# Patient Record
Sex: Male | Born: 1960 | Race: Black or African American | Hispanic: No | Marital: Married
Health system: Midwestern US, Community
[De-identification: ages and names within clinical notes are randomized; demographics above are authoritative.]

## PROBLEM LIST (undated history)

## (undated) DIAGNOSIS — M71162 Other infective bursitis, left knee: Secondary | ICD-10-CM

## (undated) DIAGNOSIS — I10 Essential (primary) hypertension: Secondary | ICD-10-CM

## (undated) DIAGNOSIS — E876 Hypokalemia: Secondary | ICD-10-CM

## (undated) DIAGNOSIS — N179 Acute kidney failure, unspecified: Secondary | ICD-10-CM

## (undated) DIAGNOSIS — Z8619 Personal history of other infectious and parasitic diseases: Secondary | ICD-10-CM

## (undated) HISTORY — DX: Other infective bursitis, left knee: M71.162

## (undated) HISTORY — DX: Acute kidney failure, unspecified: N17.9

## (undated) HISTORY — DX: Hypokalemia: E87.6

---

## 2004-04-14 ENCOUNTER — Ambulatory Visit: Payer: Self-pay | Admitting: Internal Medicine

## 2004-05-05 ENCOUNTER — Ambulatory Visit: Payer: Self-pay | Admitting: Internal Medicine

## 2004-05-25 ENCOUNTER — Ambulatory Visit: Payer: Self-pay | Admitting: Internal Medicine

## 2004-05-26 ENCOUNTER — Ambulatory Visit: Payer: Self-pay | Admitting: *Deleted

## 2004-07-06 ENCOUNTER — Ambulatory Visit: Payer: Self-pay | Admitting: Internal Medicine

## 2019-01-07 ENCOUNTER — Encounter (HOSPITAL_COMMUNITY): Payer: Self-pay | Admitting: Emergency Medicine

## 2019-01-07 ENCOUNTER — Inpatient Hospital Stay (HOSPITAL_COMMUNITY)
Admission: EM | Admit: 2019-01-07 | Discharge: 2019-01-11 | DRG: 486 | Disposition: A | Discharge status: Home / Self Care | Payer: Self-pay | Attending: Internal Medicine | Admitting: Internal Medicine

## 2019-01-07 ENCOUNTER — Other Ambulatory Visit: Payer: Self-pay

## 2019-01-07 DIAGNOSIS — Z79899 Other long term (current) drug therapy: Secondary | ICD-10-CM

## 2019-01-07 DIAGNOSIS — E669 Obesity, unspecified: Secondary | ICD-10-CM | POA: Diagnosis present

## 2019-01-07 DIAGNOSIS — Z6833 Body mass index (BMI) 33.0-33.9, adult: Secondary | ICD-10-CM

## 2019-01-07 DIAGNOSIS — M009 Pyogenic arthritis, unspecified: Secondary | ICD-10-CM | POA: Diagnosis present

## 2019-01-07 DIAGNOSIS — Z87891 Personal history of nicotine dependence: Secondary | ICD-10-CM

## 2019-01-07 DIAGNOSIS — B1920 Unspecified viral hepatitis C without hepatic coma: Secondary | ICD-10-CM | POA: Diagnosis present

## 2019-01-07 DIAGNOSIS — L03116 Cellulitis of left lower limb: Secondary | ICD-10-CM | POA: Diagnosis present

## 2019-01-07 DIAGNOSIS — Z20828 Contact with and (suspected) exposure to other viral communicable diseases: Secondary | ICD-10-CM | POA: Diagnosis present

## 2019-01-07 DIAGNOSIS — B9561 Methicillin susceptible Staphylococcus aureus infection as the cause of diseases classified elsewhere: Secondary | ICD-10-CM | POA: Diagnosis present

## 2019-01-07 DIAGNOSIS — K219 Gastro-esophageal reflux disease without esophagitis: Secondary | ICD-10-CM | POA: Diagnosis present

## 2019-01-07 DIAGNOSIS — M71162 Other infective bursitis, left knee: Principal | ICD-10-CM | POA: Diagnosis present

## 2019-01-07 DIAGNOSIS — N179 Acute kidney failure, unspecified: Secondary | ICD-10-CM

## 2019-01-07 DIAGNOSIS — A4901 Methicillin susceptible Staphylococcus aureus infection, unspecified site: Secondary | ICD-10-CM

## 2019-01-07 DIAGNOSIS — F1010 Alcohol abuse, uncomplicated: Secondary | ICD-10-CM | POA: Diagnosis present

## 2019-01-07 DIAGNOSIS — W19XXXA Unspecified fall, initial encounter: Secondary | ICD-10-CM

## 2019-01-07 DIAGNOSIS — E876 Hypokalemia: Secondary | ICD-10-CM | POA: Diagnosis present

## 2019-01-07 DIAGNOSIS — I10 Essential (primary) hypertension: Secondary | ICD-10-CM | POA: Diagnosis present

## 2019-01-07 DIAGNOSIS — Z8249 Family history of ischemic heart disease and other diseases of the circulatory system: Secondary | ICD-10-CM

## 2019-01-07 HISTORY — DX: Essential (primary) hypertension: I10

## 2019-01-07 HISTORY — DX: Personal history of other infectious and parasitic diseases: Z86.19

## 2019-01-07 MED ORDER — SODIUM CHLORIDE 0.9% FLUSH: 3.0000 mL | Freq: Once | INTRAVENOUS | Status: DC

## 2019-01-07 NOTE — ED Triage Notes (Signed)
Pt reports he thinks he has been bitten by a spider while working outside. Pt's left knee red, swollen, and warm to the touch. Reports fever at home, afebrile in triage.

## 2019-01-08 ENCOUNTER — Encounter (HOSPITAL_COMMUNITY)
Admission: EM | Disposition: A | Discharge status: Home / Self Care | Payer: Self-pay | Source: Home / Self Care | Attending: Internal Medicine

## 2019-01-08 ENCOUNTER — Emergency Department (HOSPITAL_COMMUNITY): Payer: Self-pay

## 2019-01-08 ENCOUNTER — Inpatient Hospital Stay (HOSPITAL_COMMUNITY): Payer: Self-pay | Admitting: Anesthesiology

## 2019-01-08 DIAGNOSIS — E876 Hypokalemia: Secondary | ICD-10-CM | POA: Insufficient documentation

## 2019-01-08 DIAGNOSIS — M009 Pyogenic arthritis, unspecified: Secondary | ICD-10-CM | POA: Diagnosis present

## 2019-01-08 DIAGNOSIS — Z8619 Personal history of other infectious and parasitic diseases: Secondary | ICD-10-CM

## 2019-01-08 DIAGNOSIS — I10 Essential (primary) hypertension: Secondary | ICD-10-CM

## 2019-01-08 DIAGNOSIS — M71162 Other infective bursitis, left knee: Secondary | ICD-10-CM | POA: Diagnosis present

## 2019-01-08 DIAGNOSIS — N179 Acute kidney failure, unspecified: Secondary | ICD-10-CM | POA: Insufficient documentation

## 2019-01-08 DIAGNOSIS — Z87891 Personal history of nicotine dependence: Secondary | ICD-10-CM

## 2019-01-08 HISTORY — PX: IRRIGATION AND DEBRIDEMENT KNEE: SHX5185

## 2019-01-08 LAB — PROTIME-INR
INR: 1.2 (ref 0.8–1.2)
Prothrombin Time: 15.1 seconds (ref 11.4–15.2)

## 2019-01-08 LAB — CBC WITH DIFFERENTIAL/PLATELET
Abs Immature Granulocytes: 0.76 10*3/uL — ABNORMAL HIGH (ref 0.00–0.07)
Basophils Absolute: 0 10*3/uL (ref 0.0–0.1)
Basophils Relative: 0 %
Eosinophils Absolute: 0 10*3/uL (ref 0.0–0.5)
Eosinophils Relative: 0 %
HCT: 39.3 % (ref 39.0–52.0)
Hemoglobin: 13.4 g/dL (ref 13.0–17.0)
Immature Granulocytes: 3 %
Lymphocytes Relative: 3 %
Lymphs Abs: 0.7 10*3/uL (ref 0.7–4.0)
MCH: 30.5 pg (ref 26.0–34.0)
MCHC: 34.1 g/dL (ref 30.0–36.0)
MCV: 89.3 fL (ref 80.0–100.0)
Monocytes Absolute: 1.8 10*3/uL — ABNORMAL HIGH (ref 0.1–1.0)
Monocytes Relative: 7 %
Neutro Abs: 22.9 10*3/uL — ABNORMAL HIGH (ref 1.7–7.7)
Neutrophils Relative %: 87 %
Platelets: 252 10*3/uL (ref 150–400)
RBC: 4.4 MIL/uL (ref 4.22–5.81)
RDW: 12.6 % (ref 11.5–15.5)
WBC: 26.2 10*3/uL — ABNORMAL HIGH (ref 4.0–10.5)
nRBC: 0 % (ref 0.0–0.2)

## 2019-01-08 LAB — COMPREHENSIVE METABOLIC PANEL
ALT: 25 U/L (ref 0–44)
AST: 23 U/L (ref 15–41)
Albumin: 3.3 g/dL — ABNORMAL LOW (ref 3.5–5.0)
Alkaline Phosphatase: 58 U/L (ref 38–126)
Anion gap: 13 (ref 5–15)
BUN: 28 mg/dL — ABNORMAL HIGH (ref 6–20)
CO2: 20 mmol/L — ABNORMAL LOW (ref 22–32)
Calcium: 8.6 mg/dL — ABNORMAL LOW (ref 8.9–10.3)
Chloride: 101 mmol/L (ref 98–111)
Creatinine, Ser: 1.59 mg/dL — ABNORMAL HIGH (ref 0.61–1.24)
GFR calc Af Amer: 55 mL/min — ABNORMAL LOW (ref >60)
GFR calc non Af Amer: 47 mL/min — ABNORMAL LOW (ref >60)
Glucose, Bld: 154 mg/dL — ABNORMAL HIGH (ref 70–99)
Potassium: 2.9 mmol/L — ABNORMAL LOW (ref 3.5–5.1)
Sodium: 134 mmol/L — ABNORMAL LOW (ref 135–145)
Total Bilirubin: 1.1 mg/dL (ref 0.3–1.2)
Total Protein: 6.7 g/dL (ref 6.5–8.1)

## 2019-01-08 LAB — URINALYSIS, ROUTINE W REFLEX MICROSCOPIC
Bilirubin Urine: NEGATIVE
Glucose, UA: NEGATIVE mg/dL
Ketones, ur: 5 mg/dL — AB
Leukocytes,Ua: NEGATIVE
Nitrite: NEGATIVE
Protein, ur: NEGATIVE mg/dL
Specific Gravity, Urine: 1.019 (ref 1.005–1.030)
pH: 5 (ref 5.0–8.0)

## 2019-01-08 LAB — LACTIC ACID, PLASMA
Lactic Acid, Venous: 1.6 mmol/L (ref 0.5–1.9)
Lactic Acid, Venous: 1.8 mmol/L (ref 0.5–1.9)
Lactic Acid, Venous: 2.4 mmol/L (ref 0.5–1.9)

## 2019-01-08 LAB — HIV ANTIBODY (ROUTINE TESTING W REFLEX): HIV Screen 4th Generation wRfx: NONREACTIVE

## 2019-01-08 LAB — SARS CORONAVIRUS 2 BY RT PCR (HOSPITAL ORDER, PERFORMED IN ~~LOC~~ HOSPITAL LAB): SARS Coronavirus 2: NEGATIVE

## 2019-01-08 LAB — HEMOGLOBIN A1C
Hgb A1c MFr Bld: 5.6 % (ref 4.8–5.6)
Mean Plasma Glucose: 114.02 mg/dL

## 2019-01-08 LAB — APTT: aPTT: 33 seconds (ref 24–36)

## 2019-01-08 LAB — C-REACTIVE PROTEIN: CRP: 27.4 mg/dL — ABNORMAL HIGH (ref <1.0)

## 2019-01-08 LAB — SEDIMENTATION RATE: Sed Rate: 27 mm/hr — ABNORMAL HIGH (ref 0–16)

## 2019-01-08 SURGERY — IRRIGATION AND DEBRIDEMENT KNEE
Anesthesia: General | Site: Knee | Laterality: Left

## 2019-01-08 MED ORDER — AMLODIPINE BESYLATE 5 MG PO TABS
5.0000 mg | ORAL_TABLET | Freq: Every day | ORAL | Status: DC
  Administered 2019-01-08 – 2019-01-10 (×3): 5 mg via ORAL
  Filled 2019-01-08 (×3): qty 1

## 2019-01-08 MED ORDER — SODIUM CHLORIDE 0.9 % IV SOLN
2.0000 g | Freq: Once | INTRAVENOUS | Status: AC
  Administered 2019-01-08: 2 g via INTRAVENOUS
  Filled 2019-01-08: qty 20

## 2019-01-08 MED ORDER — ONDANSETRON HCL 4 MG/2ML IJ SOLN
INTRAMUSCULAR | Status: AC
  Filled 2019-01-08: qty 2

## 2019-01-08 MED ORDER — DEXAMETHASONE SODIUM PHOSPHATE 4 MG/ML IJ SOLN
INTRAMUSCULAR | Status: DC | PRN
  Administered 2019-01-08: 10 mg via INTRAVENOUS

## 2019-01-08 MED ORDER — LACTATED RINGERS IV SOLN
INTRAVENOUS | Status: DC
  Administered 2019-01-08 – 2019-01-10 (×4): via INTRAVENOUS
  Filled 2019-01-08 (×2): qty 1000

## 2019-01-08 MED ORDER — LACTATED RINGERS IV BOLUS
1000.0000 mL | Freq: Once | INTRAVENOUS | Status: AC
  Administered 2019-01-08: 1000 mL via INTRAVENOUS

## 2019-01-08 MED ORDER — TRIAMTERENE-HCTZ 75-50 MG PO TABS
1.0000 | ORAL_TABLET | Freq: Every day | ORAL | Status: DC
  Administered 2019-01-08 – 2019-01-11 (×4): 1 via ORAL
  Filled 2019-01-08 (×6): qty 1

## 2019-01-08 MED ORDER — ACETAMINOPHEN 650 MG RE SUPP: 650.0000 mg | Freq: Four times a day (QID) | RECTAL | Status: DC | PRN

## 2019-01-08 MED ORDER — HYDROMORPHONE HCL 1 MG/ML IJ SOLN
1.0000 mg | Freq: Once | INTRAMUSCULAR | Status: AC
  Administered 2019-01-08: 1 mg via INTRAVENOUS
  Filled 2019-01-08: qty 1

## 2019-01-08 MED ORDER — ACETAMINOPHEN 325 MG PO TABS
650.0000 mg | ORAL_TABLET | Freq: Four times a day (QID) | ORAL | Status: DC | PRN
  Administered 2019-01-09 (×2): 650 mg via ORAL
  Filled 2019-01-08 (×2): qty 2

## 2019-01-08 MED ORDER — DEXAMETHASONE SODIUM PHOSPHATE 10 MG/ML IJ SOLN
INTRAMUSCULAR | Status: AC
  Filled 2019-01-08: qty 1

## 2019-01-08 MED ORDER — MIDAZOLAM HCL 2 MG/2ML IJ SOLN
INTRAMUSCULAR | Status: AC
  Filled 2019-01-08: qty 2

## 2019-01-08 MED ORDER — VITAMIN B-1 100 MG PO TABS
100.0000 mg | ORAL_TABLET | Freq: Every day | ORAL | Status: DC
  Administered 2019-01-08 – 2019-01-11 (×4): 100 mg via ORAL
  Filled 2019-01-08 (×4): qty 1

## 2019-01-08 MED ORDER — SODIUM CHLORIDE 0.9 % IV SOLN
2.0000 g | INTRAVENOUS | Status: DC
  Filled 2019-01-08: qty 20

## 2019-01-08 MED ORDER — POTASSIUM CHLORIDE CRYS ER 20 MEQ PO TBCR
40.0000 meq | EXTENDED_RELEASE_TABLET | Freq: Once | ORAL | Status: AC
  Administered 2019-01-08: 40 meq via ORAL
  Filled 2019-01-08: qty 2

## 2019-01-08 MED ORDER — CLONAZEPAM 0.5 MG PO TABS
0.5000 mg | ORAL_TABLET | Freq: Every day | ORAL | Status: DC | PRN
  Administered 2019-01-11: 0.5 mg via ORAL
  Filled 2019-01-08: qty 1

## 2019-01-08 MED ORDER — SODIUM CHLORIDE 0.9 % IV BOLUS
1000.0000 mL | Freq: Once | INTRAVENOUS | Status: AC
  Administered 2019-01-08: 1000 mL via INTRAVENOUS

## 2019-01-08 MED ORDER — POLYETHYLENE GLYCOL 3350 17 G PO PACK: 17.0000 g | PACK | Freq: Every day | ORAL | Status: DC

## 2019-01-08 MED ORDER — PROPOFOL 10 MG/ML IV BOLUS
INTRAVENOUS | Status: DC | PRN
  Administered 2019-01-08: 200 mg via INTRAVENOUS
  Administered 2019-01-08: 60 mg via INTRAVENOUS
  Administered 2019-01-08 (×2): 50 mg via INTRAVENOUS

## 2019-01-08 MED ORDER — VANCOMYCIN HCL IN DEXTROSE 1-5 GM/200ML-% IV SOLN: 1000.0000 mg | Freq: Once | INTRAVENOUS | Status: DC

## 2019-01-08 MED ORDER — VANCOMYCIN HCL IN DEXTROSE 1-5 GM/200ML-% IV SOLN
1000.0000 mg | INTRAVENOUS | Status: DC
  Administered 2019-01-09 – 2019-01-10 (×2): 1000 mg via INTRAVENOUS
  Filled 2019-01-08 (×2): qty 200

## 2019-01-08 MED ORDER — FENTANYL CITRATE (PF) 100 MCG/2ML IJ SOLN
INTRAMUSCULAR | Status: DC | PRN
  Administered 2019-01-08 (×2): 50 ug via INTRAVENOUS

## 2019-01-08 MED ORDER — OXYCODONE HCL 5 MG PO TABS
5.0000 mg | ORAL_TABLET | ORAL | Status: DC | PRN
  Administered 2019-01-08 – 2019-01-11 (×6): 5 mg via ORAL
  Filled 2019-01-08 (×6): qty 1

## 2019-01-08 MED ORDER — THIAMINE HCL 100 MG/ML IJ SOLN: 100.0000 mg | Freq: Every day | INTRAMUSCULAR | Status: DC

## 2019-01-08 MED ORDER — 0.9 % SODIUM CHLORIDE (POUR BTL) OPTIME
TOPICAL | Status: DC | PRN
  Administered 2019-01-08: 1000 mL

## 2019-01-08 MED ORDER — HYDROMORPHONE HCL 1 MG/ML IJ SOLN: 0.5000 mg | INTRAMUSCULAR | Status: DC | PRN

## 2019-01-08 MED ORDER — ONDANSETRON HCL 4 MG PO TABS: 4.0000 mg | ORAL_TABLET | Freq: Four times a day (QID) | ORAL | Status: DC | PRN

## 2019-01-08 MED ORDER — SODIUM CHLORIDE 0.9 % IR SOLN
Status: DC | PRN
  Administered 2019-01-08: 3000 mL

## 2019-01-08 MED ORDER — FOLIC ACID 1 MG PO TABS
1.0000 mg | ORAL_TABLET | Freq: Every day | ORAL | Status: DC
  Administered 2019-01-08 – 2019-01-11 (×4): 1 mg via ORAL
  Filled 2019-01-08 (×4): qty 1

## 2019-01-08 MED ORDER — FENTANYL CITRATE (PF) 250 MCG/5ML IJ SOLN
INTRAMUSCULAR | Status: AC
  Filled 2019-01-08: qty 5

## 2019-01-08 MED ORDER — MIDAZOLAM HCL 5 MG/5ML IJ SOLN
INTRAMUSCULAR | Status: DC | PRN
  Administered 2019-01-08: 2 mg via INTRAVENOUS

## 2019-01-08 MED ORDER — ONDANSETRON HCL 4 MG/2ML IJ SOLN
INTRAMUSCULAR | Status: DC | PRN
  Administered 2019-01-08: 4 mg via INTRAVENOUS

## 2019-01-08 MED ORDER — VANCOMYCIN HCL 10 G IV SOLR
2000.0000 mg | Freq: Once | INTRAVENOUS | Status: AC
  Administered 2019-01-08: 10:00:00 2000 mg via INTRAVENOUS
  Filled 2019-01-08: qty 2000

## 2019-01-08 MED ORDER — ONDANSETRON HCL 4 MG/2ML IJ SOLN: 4.0000 mg | Freq: Four times a day (QID) | INTRAMUSCULAR | Status: DC | PRN

## 2019-01-08 MED ORDER — LIDOCAINE HCL (CARDIAC) PF 100 MG/5ML IV SOSY
PREFILLED_SYRINGE | INTRAVENOUS | Status: DC | PRN
  Administered 2019-01-08: 60 mg via INTRAVENOUS

## 2019-01-08 MED ORDER — ADULT MULTIVITAMIN W/MINERALS CH
1.0000 | ORAL_TABLET | Freq: Every day | ORAL | Status: DC
  Administered 2019-01-08 – 2019-01-11 (×4): 1 via ORAL
  Filled 2019-01-08 (×4): qty 1

## 2019-01-08 SURGICAL SUPPLY — 45 items
BNDG COHESIVE 6X5 TAN STRL LF (GAUZE/BANDAGES/DRESSINGS) ×3 IMPLANT
BNDG GAUZE ELAST 4 BULKY (GAUZE/BANDAGES/DRESSINGS) ×3 IMPLANT
COVER SURGICAL LIGHT HANDLE (MISCELLANEOUS) ×3 IMPLANT
CUFF TOURN SGL QUICK 34 (TOURNIQUET CUFF)
CUFF TOURN SGL QUICK 42 (TOURNIQUET CUFF) ×3 IMPLANT
CUFF TRNQT CYL 34X4.125X (TOURNIQUET CUFF) IMPLANT
DRAPE IMP U-DRAPE 54X76 (DRAPES) ×3 IMPLANT
DRAPE ORTHO SPLIT 77X108 STRL (DRAPES) ×3
DRAPE SURG ORHT 6 SPLT 77X108 (DRAPES) ×1 IMPLANT
DRSG PAD ABDOMINAL 8X10 ST (GAUZE/BANDAGES/DRESSINGS) ×2 IMPLANT
ELECT CAUTERY BLADE 6.4 (BLADE) ×6 IMPLANT
ELECT REM PT RETURN 9FT ADLT (ELECTROSURGICAL) ×3
ELECTRODE REM PT RTRN 9FT ADLT (ELECTROSURGICAL) ×1 IMPLANT
FACESHIELD WRAPAROUND (MASK) IMPLANT
FACESHIELD WRAPAROUND OR TEAM (MASK) IMPLANT
GAUZE PACKING IODOFORM 1/2 (PACKING) ×3 IMPLANT
GAUZE SPONGE 4X4 12PLY STRL (GAUZE/BANDAGES/DRESSINGS) ×2 IMPLANT
GAUZE XEROFORM 5X9 LF (GAUZE/BANDAGES/DRESSINGS) ×3 IMPLANT
GLOVE BIOGEL PI IND STRL 7.0 (GLOVE) ×1 IMPLANT
GLOVE BIOGEL PI INDICATOR 7.0 (GLOVE) ×2
GLOVE ECLIPSE 7.0 STRL STRAW (GLOVE) ×3 IMPLANT
GLOVE SKINSENSE NS SZ7.5 (GLOVE) ×8
GLOVE SKINSENSE STRL SZ7.5 (GLOVE) ×4 IMPLANT
GOWN STRL REIN XL XLG (GOWN DISPOSABLE) ×6 IMPLANT
HANDPIECE INTERPULSE COAX TIP (DISPOSABLE) ×3
KIT BASIN OR (CUSTOM PROCEDURE TRAY) ×3 IMPLANT
KIT TURNOVER KIT B (KITS) ×3 IMPLANT
MANIFOLD NEPTUNE II (INSTRUMENTS) ×3 IMPLANT
NS IRRIG 1000ML POUR BTL (IV SOLUTION) ×3 IMPLANT
PACK ORTHO EXTREMITY (CUSTOM PROCEDURE TRAY) ×3 IMPLANT
PACK UNIVERSAL I (CUSTOM PROCEDURE TRAY) ×3 IMPLANT
PAD ARMBOARD 7.5X6 YLW CONV (MISCELLANEOUS) ×6 IMPLANT
SET HNDPC FAN SPRY TIP SCT (DISPOSABLE) ×1 IMPLANT
SPONGE LAP 18X18 RF (DISPOSABLE) ×3 IMPLANT
STOCKINETTE IMPERVIOUS 9X36 MD (GAUZE/BANDAGES/DRESSINGS) ×3 IMPLANT
SUT ETHILON 2 0 FS 18 (SUTURE) ×3 IMPLANT
SUT MON AB 2-0 CT1 36 (SUTURE) ×3 IMPLANT
SWAB CULTURE ESWAB REG 1ML (MISCELLANEOUS) ×3 IMPLANT
TOWEL GREEN STERILE (TOWEL DISPOSABLE) ×3 IMPLANT
TOWEL GREEN STERILE FF (TOWEL DISPOSABLE) ×3 IMPLANT
TUBE CONNECTING 12'X1/4 (SUCTIONS) ×1
TUBE CONNECTING 12X1/4 (SUCTIONS) ×2 IMPLANT
UNDERPAD 30X30 (UNDERPADS AND DIAPERS) ×3 IMPLANT
WATER STERILE IRR 1000ML POUR (IV SOLUTION) ×3 IMPLANT
YANKAUER SUCT BULB TIP NO VENT (SUCTIONS) ×3 IMPLANT

## 2019-01-08 NOTE — Progress Notes (Signed)
Pharmacy Antibiotic Note  Chris Lynch is a 58 y.o. male admitted on 01/07/2019 with cellulitis.  Pharmacy has been consulted for vancomycin dosing. Pt is afebrile and WBC is elevated at 26.2. SCr is elevated but unclear baseline. Lactic acid is <2.   Plan: Vancomycin 2gm IV x 1 then 1gm IV Q24H F/u renal fxn, C&S, clinical status and peak/trough at SS    Temp (24hrs), Avg:98.2 F (36.8 C), Min:98.2 F (36.8 C), Max:98.2 F (36.8 C)  Recent Labs  Lab 01/08/19 0003  WBC 26.2*  CREATININE 1.59*  LATICACIDVEN 1.6    Estimated Creatinine Clearance: 57.1 mL/min (A) (by C-G formula based on SCr of 1.59 mg/dL (H)).    No Known Allergies  Antimicrobials this admission: Vanc 11/24>> CTX x 1 11/24*  Dose adjustments this admission: N/A  Microbiology results: Pending  Thank you for allowing pharmacy to be a part of this patient's care.  Aalina Brege, Rande Lawman 01/08/2019 7:20 AM

## 2019-01-08 NOTE — ED Notes (Signed)
Pt is NSR on monitor 

## 2019-01-08 NOTE — Op Note (Signed)
   Date of Surgery: 01/08/2019  INDICATIONS: Chris Lynch is a 58 y.o.-year-old male with a septic prepatellar bursitis;  The patient did consent to the procedure after discussion of the risks and benefits.  PREOPERATIVE DIAGNOSIS: Septic left prepatellar bursitis  POSTOPERATIVE DIAGNOSIS: Same.  PROCEDURE: Irrigation and debridement of left prepatellar septic bursitis  SURGEON: N. Eduard Roux, M.D.  ASSIST: none.  ANESTHESIA:  general  IV FLUIDS AND URINE: See anesthesia.  ESTIMATED BLOOD LOSS: minimal mL.  IMPLANTS: none  DRAINS: iodoform packing  COMPLICATIONS: see description of procedure.  DESCRIPTION OF PROCEDURE: The patient was brought to the operating room and placed supine on the operating table.  The patient had been signed prior to the procedure and this was documented. The patient had the anesthesia placed by the anesthesiologist.  A time-out was performed to confirm that this was the correct patient, site, side and location. The patient did receive antibiotics after cultures were obtained and was re-dosed during the procedure as needed at indicated intervals.  The patient had the operative extremity prepped and draped in the standard surgical fashion.    Incision was made on the lateral aspect of the patella border in a full-thickness fashion.  Blunt dissection was then carried down to the bursa with tonsil.  There was return of bloody pus.  This was cultured.  Sharp excisional debridement was then performed using a rondure.  After thorough debridement hemostasis was obtained.  Through noncellulitic skin I aspirated the left knee joint to confirm no evidence of septic arthritis.  I then thoroughly irrigated the bursa with 3 L normal saline.  This was then packed with iodoform.  The surgical incision was partially closed with interrupted 2-0 nylon sutures.  Sterile dressings were applied.  The blisters were dressed with Xeroform.  Patient tolerated the procedure well had no me  complications.  POSTOPERATIVE PLAN: We will follow the patient clinically for improvement.  He will need infectious disease consult for recommendation on antibiotic treatment and duration.  We will remove the iodoform packing in a couple days.  Azucena Cecil, MD 5:39 PM

## 2019-01-08 NOTE — Transfer of Care (Signed)
Immediate Anesthesia Transfer of Care Note  Patient: Chris Lynch  Procedure(s) Performed: IRRIGATION AND DEBRIDEMENT OF PREPATELLAR LEFT KNEE (Left Knee)  Patient Location: PACU  Anesthesia Type:General  Level of Consciousness: drowsy  Airway & Oxygen Therapy: Patient Spontanous Breathing and Patient connected to face mask oxygen  Post-op Assessment: Report given to RN and Post -op Vital signs reviewed and stable  Post vital signs: Reviewed and stable  Last Vitals:  Vitals Value Taken Time  BP 141/87 01/08/19 1752  Temp 37.2 C 01/08/19 1752  Pulse 96 01/08/19 1752  Resp 15 01/08/19 1757  SpO2 95 % 01/08/19 1752  Vitals shown include unvalidated device data.  Last Pain:  Vitals:   01/08/19 1752  TempSrc:   PainSc: (P) Asleep         Complications: No apparent anesthesia complications

## 2019-01-08 NOTE — ED Provider Notes (Addendum)
Medical screening examination/treatment/procedure(s) were conducted as a shared visit with non-physician practitioner(s) and myself.  I personally evaluated the patient during the encounter. Briefly, the patient is a 58 y.o. male with no significant medical history who presents to the ED with leg infection.  Patient with unremarkable vitals.  Patient appears to have left lower leg cellulitis.  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  He was working on a deck and has been on his knees over the last several days.  Had skin breakdown over his left knee and now has progressed to what appears to be a cellulitis of his lower leg with some blistering.  Has fairly good range of motion at his knee and doubt septic joint.  Has skin breakdown over the patellar which is likely the nidus for infection.  Redness and swelling tract both up and down the leg.  He has good pulses.  Does not appear to have necrotizing process.  Patient with leukocytosis of 26 but normal lactic acid.  X-rays were overall unremarkable.  No soft tissue gas.  Overall consistent with a cellulitis with possible septic bursa, will consult ortho.  There is no joint effusion of the knee.  Will admit for IV antibiotics.  Ortho will take to OR for septic bursa. Patient with surrounding cellulitis.   Hemodynamically stable throughout my care.  This chart was dictated using voice recognition software.  Despite best efforts to proofread,  errors can occur which can change the documentation meaning.     EKG Interpretation  Date/Time:  Tuesday January 08 2019 07:02:01 EST Ventricular Rate:  103 PR Interval:    QRS Duration: 97 QT Interval:  312 QTC Calculation: 409 R Axis:   66 Text Interpretation: Sinus tachycardia Anteroseptal infarct, old Nonspecific repol abnormality, diffuse leads Confirmed by Lennice Sites [phone]) on 01/08/2019 7:35:52 AM           Lennice Sites, DO 01/08/19 6045    Lennice Sites, DO 01/08/19 4098

## 2019-01-08 NOTE — ED Notes (Signed)
Patient transported to X-ray 

## 2019-01-08 NOTE — H&P (Signed)
Date: 01/08/2019               Patient Name:  LENNEX PIETILA MRN: [id number]  DOB: 1960-12-09 Age / Sex: 58 y.o., male   PCP: Leonard Downing, MD         Medical Service: Internal Medicine Teaching Service         Attending Physician: Dr. Lucious Groves, DO    First Contact: Dr. Madilyn Fireman Pager: [phone]  Second Contact: Dr. Koleen Distance Pager: [phone]       After Hours (After 5p/  First Contact Pager: [phone]  weekends / holidays): Second Contact Pager: [phone]   Chief Complaint: Left knee pain  History of Present Illness: Peretz Thieme is a 58 y.o male with HTN and Hepatitis C s/p treatment with interferon who presented to the ED with progressive left knee pain of 4 days duration. History was obtained via the patient and through chart review.   Patient states that on Thursday he was working under a deck. He felt something crawling on his leg and subsequently felt pinched around his left knee. He thought that a spider might have bit him. On Friday he developed a pimple and subsequently lanced it with a warm needle. Over the course of the next two days he developed significant pain and swelling. He tried to rest and stay off his knee and used warm compresses to help with the pain/swelling. On Monday he went to work but noticed significant pain while trying to move around. That evening he developed fever of 102F which prompted him to seek further medical care. He has noticed some discharge from his knee but is unable to assess the quality or consistency. He has noticed blistering down his left lower leg. He is never had anything like this before. He does have hypertension but denies a history of diabetes. ROS is positive for some nausea but negative for headaches, visual changes, sinus congestion, SHOB, CP, abdominal pain, diarrhea, rash.   Meds: Patient is currently no on any prescription medications.    Allergies: Allergies as of 01/07/2019  . (No Known Allergies)   Past  Medical History:  Diagnosis Date  . Hypertension    Family History: His father had his first heart attack at the age of 21. He has since had multiple intravascular procedures for peripheral artery disease and coronary artery disease. His mother and sister both died of metastatic lung cancer.  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 He is divorced and has three grown children. He does have a couple grandchildren. He is currently dating someone. He is not smoked in over 20 years. He did use injection drugs in his 59s but has not used them in several years. He does drink 1/2 pint per liquor per weekday and one point will occur on weekends. This is a new habit over the past year.  Review of Systems: A complete ROS was negative except as per HPI.  Physical Exam: Blood pressure (!) 152/77, pulse (!) 104, temperature 98 F (36.7 C), temperature source Oral, resp. rate 18, height 5\' 7"  (1.702 m), weight 97.5 kg, SpO2 97 %.  General: Obese male in no acute distress HENT: Normocephalic, atraumatic, moist mucus membranes, thick neck Pulm: Good air movement with no wheezing or crackles  CV: RRR, no murmurs, no rubs  Abdomen: Active bowel sounds, soft, non-distended, no tenderness to palpation  Extremities: Pulses palpable in all extremities, erythema and  tenderness to palpation of the left knee with distal clear vesicles/bulla Skin: Warm and dry  Neuro: Alert and oriented x 3  EKG: personally reviewed my interpretation is sinus rhythm with normal axis and intervals. ST scoping noted in the inferior lateral leads.   DG Left Femur and Tibia/Fibula  Subcutaneous edema compatible with cellulitis. Negative for soft tissue gas or foreign body.  Assessment & Plan by Problem: Active Problems:   Arthritis, septic, knee (HCC)  Daelin Haste is a 58 y.o male with HTN and Hepatitis C s/p treatment with interferon who presented to the ED with signs and symptoms  concerning for septic arthritis of the left knee. Orthopedic surgery has been consult and plan to take the patient to the OR this afternoon for I&D. He was subsequently admitted for further evaluation and management.   Septic arthritis, left knee - Presented with left knee pain, erythema, and swelling and systemic signs of infection concerning for septic arthritis of the left knee  - Continue vancomycin and ceftriaxone  - Follow aspirate cultures and operative cultures  - Add on cell count with crystal analysis to the aspirate  - Appreciate orthopedic consult and plan for OR this evening  - Pain control with Oxycodone 5 mg every 4 hours PRN and Dilaudid 0.5 mg every 2 hours PRN for pain  - Daily miralax while on opiates.  - PT/OT consult after surgery  - Continue NPO   Possible AKI Hypokalemia  - No baseline creatinine for comparison  - Will give IVF  - 40 mEq PO potassium  - Recheck BMP in the AM  Hypertension - Continue to monitor  - May need to start a medication post-op   Probable OSA - Endorses loud snoring and witnessed apnea with thick neck  - Monitor respiratory status after OR  Alcohol Use Disorder - No history of withdrawal but drinks heavily  - CIWA protocol   Diet: NPO  VTE: SCDs until after surgery  CODE STATUS: Full Code  Dispo: Admit patient to Inpatient with expected length of stay greater than 2 midnights.  SignedLevora Dredge, MD 01/08/2019, 10:28 AM  Pager: [phone]

## 2019-01-08 NOTE — Anesthesia Procedure Notes (Addendum)
Procedure Name: LMA Insertion Date/Time: 01/08/2019 4:58 PM Performed by: Griffin Dakin, CRNA Pre-anesthesia Checklist: Patient identified, Emergency Drugs available, Suction available and Patient being monitored Patient Re-evaluated:Patient Re-evaluated prior to induction Oxygen Delivery Method: Circle system utilized Preoxygenation: Pre-oxygenation with 100% oxygen Induction Type: IV induction Ventilation: Mask ventilation without difficulty LMA: LMA inserted LMA Size: 5.0 Tube type: Oral Number of attempts: 1 Placement Confirmation: positive ETCO2 and breath sounds checked- equal and bilateral Tube secured with: Tape Dental Injury: Teeth and Oropharynx as per pre-operative assessment

## 2019-01-08 NOTE — Consult Note (Signed)
Reason for Consult:Left knee pain Referring Physician: A Curatolo  Chris Lynch is an 58 y.o. male.  HPI: Chris Lynch comes in with a 5d hx/o left knee pain. It seemed to start late last week and he thought he may have been bit by a spider. He put ice packs on it through the weekend but it swelled up quite a bit on Sunday after spending some time on it at a store. Nevertheless he went to work yesterday and he had some drainage from it. He ran fevers over 102 at home and decided to come to the ED for treatment. He denies similar hx/o. 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  Past Medical History:  Diagnosis Date  . Hypertension     History reviewed. No pertinent surgical history.  No family history on file.  Social History:  reports that he has quit smoking. He has never used smokeless tobacco. He reports current alcohol use. He reports that he does not use drugs.  Allergies: No Known Allergies  Medications: I have reviewed the patient's current medications.  Results for orders placed or performed during the hospital encounter of 01/07/19 (from the past 48 hour(s))  Lactic acid, plasma     Status: None   Collection Time: 01/08/19 12:03 AM  Result Value Ref Range   Lactic Acid, Venous 1.6 0.5 - 1.9 mmol/L    Comment: Performed at [facility], [address] [place], [place] [address]  Comprehensive metabolic panel     Status: Abnormal   Collection Time: 01/08/19 12:03 AM  Result Value Ref Range   Sodium 134 (L) 135 - 145 mmol/L   Potassium 2.9 (L) 3.5 - 5.1 mmol/L   Chloride 101 98 - 111 mmol/L   CO2 20 (L) 22 - 32 mmol/L   Glucose, Bld 154 (H) 70 - 99 mg/dL   BUN 28 (H) 6 - 20 mg/dL   Creatinine, Ser 4.17 (H) 0.61 - 1.24 mg/dL   Calcium 8.6 (L) 8.9 - 10.3 mg/dL   Total Protein 6.7 6.5 - 8.1 g/dL   Albumin 3.3 (L) 3.5 - 5.0 g/dL   AST 23 15 - 41 U/L   ALT 25 0 - 44 U/L   Alkaline Phosphatase 58 38 - 126 U/L   Total Bilirubin 1.1 0.3 - 1.2 mg/dL   GFR calc  non Af Amer 47 (L) >60 mL/min   GFR calc Af Amer 55 (L) >60 mL/min   Anion gap 13 5 - 15    Comment: Performed at [facility], [address], [place], [place] [address]  CBC with Differential     Status: Abnormal   Collection Time: 01/08/19 12:03 AM  Result Value Ref Range   WBC 26.2 (H) 4.0 - 10.5 K/uL   RBC 4.40 4.22 - 5.81 MIL/uL   Hemoglobin 13.4 13.0 - 17.0 g/dL   HCT 48.1 85.6 - 31.4 %   MCV 89.3 80.0 - 100.0 fL   MCH 30.5 26.0 - 34.0 pg   MCHC 34.1 30.0 - 36.0 g/dL   RDW 97.0 26.3 - 78.5 %   Platelets 252 150 - 400 K/uL   nRBC 0.0 0.0 - 0.2 %   Neutrophils Relative % 87 %   Neutro Abs 22.9 (H) 1.7 - 7.7 K/uL   Lymphocytes Relative 3 %   Lymphs Abs 0.7 0.7 - 4.0 K/uL   Monocytes Relative 7 %   Monocytes Absolute 1.8 (H) 0.1 -  1.0 K/uL   Eosinophils Relative 0 %   Eosinophils Absolute 0.0 0.0 - 0.5 K/uL   Basophils Relative 0 %   Basophils Absolute 0.0 0.0 - 0.1 K/uL   Immature Granulocytes 3 %   Abs Immature Granulocytes 0.76 (H) 0.00 - 0.07 K/uL    Comment: Performed at [facility] [address], [place], [place] [address]  Lactic acid, plasma     Status: Abnormal   Collection Time: 01/08/19  7:54 AM  Result Value Ref Range   Lactic Acid, Venous 2.4 (HH) 0.5 - 1.9 mmol/L    Comment: CRITICAL RESULT CALLED TO, READ BACK BY AND VERIFIED WITH: C.MARSHALL,RN11/24/2020 0906 DAVISB Performed at [facility], [place] [address] [place], [place] [address]   APTT     Status: None   Collection Time: 01/08/19  7:54 AM  Result Value Ref Range   aPTT 33 24 - 36 seconds    Comment: Performed at [facility] [address], [place], [place] [address]  Protime-INR     Status: None   Collection Time: 01/08/19  7:54 AM  Result Value Ref Range   Prothrombin Time 15.1 11.4 - 15.2 seconds   INR 1.2 0.8 - 1.2    Comment: (NOTE) INR goal varies based on device and disease states. Performed at [facility], [place] [address], [place],  [place] [address]     Dg Chest 1 View  Result Date: 01/08/2019 CLINICAL DATA:  Leg swelling EXAM: CHEST  1 VIEW COMPARISON:  None. FINDINGS: The heart size and mediastinal contours are within normal limits. Both lungs are clear. The visualized skeletal structures are unremarkable. IMPRESSION: No acute cardiopulmonary findings. Electronically Signed   By: Davina Poke M.D.   On: 01/08/2019 07:56   Dg Tibia/fibula Left  Result Date: 01/08/2019 CLINICAL DATA:  Left lower leg pain, redness and swelling. The patient reports he may have sustained a spider bite. EXAM: LEFT TIBIA AND FIBULA - 2 VIEW COMPARISON:  None. FINDINGS: Subcutaneous tissues are infiltrated. No soft tissue gas or radiopaque foreign body. No bony or joint abnormality. IMPRESSION: Subcutaneous edema compatible with cellulitis. Negative for soft tissue gas or foreign body. Electronically Signed   By: Inge Rise M.D.   On: 01/08/2019 07:57   Dg Knee Complete 4 Views Left  Result Date: 01/08/2019 CLINICAL DATA:  Left knee pain, redness and swelling. The patient may have suffered a spider bite. Initial encounter. EXAM: LEFT KNEE - COMPLETE 4+ VIEW COMPARISON:  None. FINDINGS: Soft tissues about the knee appear swollen but no soft tissue gas or radiopaque foreign body is identified. There is no knee joint effusion. No focal bony lesion. IMPRESSION: Soft tissue swelling compatible with cellulitis. No underlying bony or joint abnormality. No foreign body or soft tissue gas. Electronically Signed   By: Inge Rise M.D.   On: 01/08/2019 07:56   Dg Femur 1v Left  Result Date: 01/08/2019 CLINICAL DATA:  Left upper leg pain and swelling. The patient reports he may have sustained a spider bite. EXAM: LEFT FEMUR 1 VIEW COMPARISON:  None. FINDINGS: Subcutaneous fatty tissues appear infiltrated. No soft tissue gas or radiopaque foreign body. No bony abnormality. IMPRESSION: Infiltration of subcutaneous fatty tissues compatible with  cellulitis. Negative for soft tissue gas or foreign body. Electronically Signed   By: Inge Rise M.D.   On: 01/08/2019 07:58    Review of Systems  Constitutional: Positive for fever. Negative for chills and weight loss.  HENT: Negative for ear discharge, ear pain, hearing loss and tinnitus.   Eyes: Negative for blurred vision, double vision, photophobia and pain.  Respiratory: Negative for cough, sputum production and shortness of breath.   Cardiovascular: Negative for chest pain.  Gastrointestinal: Negative for abdominal pain, nausea and vomiting.  Genitourinary: Negative for dysuria, flank pain, frequency and urgency.  Musculoskeletal: Positive for joint pain (Left knee) and myalgias (Left thigh). Negative for back pain, falls and neck pain.  Neurological: Negative for dizziness, tingling, sensory change, focal weakness, loss of consciousness and headaches.  Endo/Heme/Allergies: Does not bruise/bleed easily.  Psychiatric/Behavioral: Negative for depression, memory loss and substance abuse. The patient is not nervous/anxious.    Blood pressure (!) 147/85, pulse (!) 105, temperature 98.2 F (36.8 C), temperature source Oral, resp. rate 20, height 5\' 7"  (1.702 m), weight 97.5 kg, SpO2 99 %. Physical Exam  Constitutional: He appears well-developed and well-nourished. No distress.  HENT:  Head: Normocephalic and atraumatic.  Eyes: Conjunctivae are normal. Right eye exhibits no discharge. Left eye exhibits no discharge. No scleral icterus.  Neck: Normal range of motion.  Cardiovascular: Normal rate and regular rhythm.  Respiratory: Effort normal. No respiratory distress.  Musculoskeletal:     Comments: LLE No traumatic wounds, ecchymosis, or rash  Knee erythematous, prepatellar bursa mildly boggy, mod TTP, mod TTP thigh, erythema spreading prox and distal to knee, bullae on lower leg  No knee or ankle effusion  Knee stable to varus/ valgus and anterior/posterior stress  Sens DPN,  SPN, TN intact  Motor EHL, ext, flex, evers 5/5  DP 2+, PT 1+, 2+ NP edema  Neurological: He is alert.  Skin: Skin is warm and dry. He is not diaphoretic.  Psychiatric: He has a normal mood and affect. His behavior is normal.    Assessment/Plan: Left knee septic bursitis -- Have aspirated bursa and will send for culture. Will plan to take to OR this afternoon for I&D by Dr. Roda ShuttersXu. Please keep NPO. LLE cellulitis -- Have asked medicine to admit to manage abx. Appreciate their help. HTN    Freeman CaldronMichael J. Runa Whittingham, PA-C Orthopedic Surgery [phone] 01/08/2019, 9:07 AM

## 2019-01-08 NOTE — ED Notes (Signed)
Pt resting with eyes closed.

## 2019-01-08 NOTE — Anesthesia Preprocedure Evaluation (Signed)
Anesthesia Evaluation  Patient identified by MRN, date of birth, ID band Patient awake    Reviewed: Allergy & Precautions, H&P , NPO status , Patient's Chart, lab work & pertinent test results, reviewed documented beta blocker date and time   Airway Mallampati: II  TM Distance: >3 FB Neck ROM: full    Dental no notable dental hx.    Pulmonary neg pulmonary ROS, former smoker,    Pulmonary exam normal breath sounds clear to auscultation       Cardiovascular Exercise Tolerance: Good hypertension, Pt. on medications negative cardio ROS   Rhythm:regular Rate:Normal     Neuro/Psych negative neurological ROS  negative psych ROS   GI/Hepatic GERD  Medicated,(+) Hepatitis -, CHepatitis C s/p treatment with interferon    Endo/Other  negative endocrine ROS  Renal/GU ARFRenal disease  negative genitourinary   Musculoskeletal   Abdominal   Peds  Hematology negative hematology ROS (+)   Anesthesia Other Findings   Reproductive/Obstetrics negative OB ROS                            Anesthesia Physical Anesthesia Plan  ASA: II  Anesthesia Plan: General   Post-op Pain Management:    Induction:   PONV Risk Score and Plan: 2 and Ondansetron, Dexamethasone and Treatment may vary due to age or medical condition  Airway Management Planned:   Additional Equipment:   Intra-op Plan:   Post-operative Plan:   Informed Consent: I have reviewed the patients History and Physical, chart, labs and discussed the procedure including the risks, benefits and alternatives for the proposed anesthesia with the patient or authorized representative who has indicated his/her understanding and acceptance.     Dental Advisory Given  Plan Discussed with: CRNA, Anesthesiologist and Surgeon  Anesthesia Plan Comments:         Anesthesia Quick Evaluation

## 2019-01-08 NOTE — Progress Notes (Signed)
Pt received from PACU. CHG complete. VSS. L knee drsg clean, dry and intact. Pt oriented to room and unit. Call light in reach.  Clyde Canterbury, RN

## 2019-01-08 NOTE — ED Provider Notes (Signed)
[facility] EMERGENCY DEPARTMENT Provider Note   CSN: [id number] Arrival date & time: 01/07/19  2325     History   Chief Complaint Chief Complaint  Patient presents with   Wound Infection    HPI Chris Lynch is a 58 y.o. male with history of hypertension presents today for evaluation of acute onset, progressively worsening left knee pain for 4 days.  He reports that he has been working on his knees outside for the last several days and a few days ago felt as though an insect was crawling up his leg and thinks it may have bit him.  Reports he noticed a "pimple" to the left knee on Friday 4 days ago which he was able to express a small amount of purulence out of.  Since then he has had progressively worsening pain and swelling and erythema to the left knee.  Pain is constant, throbbing, worsens with flexion of the knee and attempts to weight-bear.  Denies numbness or weakness to the lower extremity.  Since then has had pain radiating up the lateral aspect of the left thigh and down into the left calf.  Yesterday developed fever up to 102 F, chills, nausea.  Denies abdominal pain, vomiting, or urinary symptoms.  No shortness of breath or cough.  No known sick contacts.     The history is provided by the patient.    Past Medical History:  Diagnosis Date   Hypertension     There are no active problems to display for this patient.   History reviewed. No pertinent surgical history.      Home Medications    Prior to Admission medications   Not on File    Family History No family history on file.  Social History Social History   Tobacco Use   Smoking status: Former Smoker   Smokeless tobacco: Never Used  Substance Use Topics   Alcohol use: Yes   Drug use: Never     Allergies   Patient has no known allergies.   Review of Systems Review of Systems  Constitutional: Positive for chills and fever.  Respiratory: Negative for shortness of  breath.   Gastrointestinal: Positive for nausea. Negative for abdominal pain, diarrhea and vomiting.  Musculoskeletal: Positive for arthralgias.  Skin: Positive for color change.  Neurological: Negative for weakness and numbness.  All other systems reviewed and are negative.    Physical Exam Updated Vital Signs BP 128/76    Pulse (!) 102    Temp 98.2 F (36.8 C) (Oral)    Resp 20    Ht 5\' 7"  (1.702 m)    Wt 97.5 kg    SpO2 94%    BMI 33.67 kg/m   Physical Exam Vitals signs and nursing note reviewed.  Constitutional:      General: He is not in acute distress.    Appearance: He is well-developed.  HENT:     Head: Normocephalic and atraumatic.  Eyes:     General:        Right eye: No discharge.        Left eye: No discharge.     Conjunctiva/sclera: Conjunctivae normal.  Neck:     Vascular: No JVD.     Trachea: No tracheal deviation.  Cardiovascular:     Rate and Rhythm: Regular rhythm. Tachycardia present.     Pulses: Normal pulses.     Heart sounds: Normal heart sounds.     Comments: 2+ radial and DP/PT pulses bilaterally.  2+ pitting edema of the left lower extremity, compartments are soft. Pulmonary:     Effort: Pulmonary effort is normal.     Comments: Mild bibasilar Rales noted.  SPO2 saturations 99% on room air, speaking full sentences without difficulty Abdominal:     General: Bowel sounds are normal. There is no distension.     Palpations: Abdomen is soft.     Tenderness: There is no abdominal tenderness. There is no guarding or rebound.  Musculoskeletal:        General: Swelling and tenderness present.     Left lower leg: Edema present.     Comments: See below image.  Patient with marked erythema of the anterior aspect of the left knee with extension into the lower leg.  There is edema and blistering noted to the lower leg as well.  Diffuse tenderness to palpation of the left knee as well as the distal left lateral thigh.  Induration noted.  5/5 strength of BLE  major muscle groups.  Limited active range of motion of the left knee but is able to flex 30 degrees  Skin:    General: Skin is warm and dry.     Findings: No erythema.  Neurological:     Mental Status: He is alert.     Comments: Sensation intact to light touch of bilateral lower extremities.   Psychiatric:        Behavior: Behavior normal.         ED Treatments / Results  Labs (all labs ordered are listed, but only abnormal results are displayed) Labs Reviewed  LACTIC ACID, PLASMA - Abnormal; Notable for the following components:      Result Value   Lactic Acid, Venous 2.4 (*)    All other components within normal limits  COMPREHENSIVE METABOLIC PANEL - Abnormal; Notable for the following components:   Sodium 134 (*)    Potassium 2.9 (*)    CO2 20 (*)    Glucose, Bld 154 (*)    BUN 28 (*)    Creatinine, Ser 1.59 (*)    Calcium 8.6 (*)    Albumin 3.3 (*)    GFR calc non Af Amer 47 (*)    GFR calc Af Amer 55 (*)    All other components within normal limits  CBC WITH DIFFERENTIAL/PLATELET - Abnormal; Notable for the following components:   WBC 26.2 (*)    Neutro Abs 22.9 (*)    Monocytes Absolute 1.8 (*)    Abs Immature Granulocytes 0.76 (*)    All other components within normal limits  URINALYSIS, ROUTINE W REFLEX MICROSCOPIC - Abnormal; Notable for the following components:   Hgb urine dipstick SMALL (*)    Ketones, ur 5 (*)    Bacteria, UA RARE (*)    All other components within normal limits  CULTURE, BLOOD (ROUTINE X 2)  CULTURE, BLOOD (ROUTINE X 2)  URINE CULTURE  BODY FLUID CULTURE  SARS CORONAVIRUS 2 BY RT PCR (HOSPITAL ORDER, PERFORMED IN [facility])  LACTIC ACID, PLASMA  APTT  PROTIME-INR  C-REACTIVE PROTEIN  SEDIMENTATION RATE    EKG EKG Interpretation  Date/Time:  Tuesday January 08 2019 07:02:01 EST Ventricular Rate:  103 PR Interval:    QRS Duration: 97 QT Interval:  312 QTC Calculation: 409 R Axis:   66 Text  Interpretation: Sinus tachycardia Anteroseptal infarct, old Nonspecific repol abnormality, diffuse leads Confirmed by Virgina NorfolkAdam, [place] [phone] on 01/08/2019 7:35:52 AM   Radiology Dg Chest 1 View  Result Date: 01/08/2019 CLINICAL DATA:  Leg swelling EXAM: CHEST  1 VIEW COMPARISON:  None. FINDINGS: The heart size and mediastinal contours are within normal limits. Both lungs are clear. The visualized skeletal structures are unremarkable. IMPRESSION: No acute cardiopulmonary findings. Electronically Signed   By: Duanne Guess M.D.   On: 01/08/2019 07:56   Dg Tibia/fibula Left  Result Date: 01/08/2019 CLINICAL DATA:  Left lower leg pain, redness and swelling. The patient reports he may have sustained a spider bite. EXAM: LEFT TIBIA AND FIBULA - 2 VIEW COMPARISON:  None. FINDINGS: Subcutaneous tissues are infiltrated. No soft tissue gas or radiopaque foreign body. No bony or joint abnormality. IMPRESSION: Subcutaneous edema compatible with cellulitis. Negative for soft tissue gas or foreign body. Electronically Signed   By: Drusilla Kanner M.D.   On: 01/08/2019 07:57   Dg Knee Complete 4 Views Left  Result Date: 01/08/2019 CLINICAL DATA:  Left knee pain, redness and swelling. The patient may have suffered a spider bite. Initial encounter. EXAM: LEFT KNEE - COMPLETE 4+ VIEW COMPARISON:  None. FINDINGS: Soft tissues about the knee appear swollen but no soft tissue gas or radiopaque foreign body is identified. There is no knee joint effusion. No focal bony lesion. IMPRESSION: Soft tissue swelling compatible with cellulitis. No underlying bony or joint abnormality. No foreign body or soft tissue gas. Electronically Signed   By: Drusilla Kanner M.D.   On: 01/08/2019 07:56   Dg Femur 1v Left  Result Date: 01/08/2019 CLINICAL DATA:  Left upper leg pain and swelling. The patient reports he may have sustained a spider bite. EXAM: LEFT FEMUR 1 VIEW COMPARISON:  None. FINDINGS: Subcutaneous fatty tissues  appear infiltrated. No soft tissue gas or radiopaque foreign body. No bony abnormality. IMPRESSION: Infiltration of subcutaneous fatty tissues compatible with cellulitis. Negative for soft tissue gas or foreign body. Electronically Signed   By: Drusilla Kanner M.D.   On: 01/08/2019 07:58    Procedures .Critical Care Performed by: Jeanie Sewer, PA-C Authorized by: Jeanie Sewer, PA-C   Critical care provider statement:    Critical care time (minutes):  40   Critical care was necessary to treat or prevent imminent or life-threatening deterioration of the following conditions:  Sepsis   Critical care was time spent personally by me on the following activities:  Discussions with consultants, evaluation of patient's response to treatment, examination of patient, ordering and performing treatments and interventions, ordering and review of laboratory studies, ordering and review of radiographic studies, pulse oximetry, re-evaluation of patient's condition, obtaining history from patient or surrogate and review of old charts   (including critical care time)  Medications Ordered in ED Medications  sodium chloride flush (NS) 0.9 % injection 3 mL (3 mLs Intravenous Not Given 01/08/19 0940)  vancomycin (VANCOCIN) 2,000 mg in sodium chloride 0.9 % 500 mL IVPB (has no administration in time range)  vancomycin (VANCOCIN) IVPB 1000 mg/200 mL premix (has no administration in time range)  lactated ringers bolus 1,000 mL (has no administration in time range)    Followed by  lactated ringers infusion (has no administration in time range)  cefTRIAXone (ROCEPHIN) 2 g in sodium chloride 0.9 % 100 mL IVPB (2 g Intravenous New Bag/Given 01/08/19 0934)  HYDROmorphone (DILAUDID) injection 1 mg (1 mg Intravenous Given 01/08/19 0934)  sodium chloride 0.9 % bolus 1,000 mL (1,000 mLs Intravenous New Bag/Given 01/08/19 0935)     Initial Impression / Assessment and Plan / ED Course  I have reviewed  the triage vital  signs and the nursing notes.  Pertinent labs & imaging results that were available during my care of the patient were reviewed by me and considered in my medical decision making (see chart for details).        Patient presenting for evaluation of progressively worsening cellulitis of the left knee extending into the thigh and lower leg.  He is afebrile in the ED but was febrile at home, persistently tachycardic in the ED.  Uncomfortable but nontoxic in appearance.  He is neurovascularly intact.  Radiographs were obtained which show no evidence of subcutaneous air but does do show findings consistent with cellulitis.  No evidence of acute osseous abnormality.  Given a tachycardia, leukocytosis of 26.2 and elevated lactate code sepsis was initiated he was given broad-spectrum antibiotics and blood cultures were obtained.  Remainder of lab work reviewed by me shows elevation in BUN and creatinine, hypokalemia with potassium 2.9.  However, EKG shows no QT prolongation or other acute ischemic abnormalities or arrhythmia. He was given IV fluids, likely is dehydrated in the setting of infection. However, no evidence of septic shock as his blood pressure remains normal.   CONSULT: Spoke with Charma Igo, PA with orthopedics who will see the patient emergently in the ED.  He was able to express some purulence from the bursa and this fluid will be cultured.  Recommends operative drainage later today.  Will obtain rapid Covid test for this.  Internal medicine teaching service to admit.  Patient seen and evaluated by Dr. Lockie Mola who agrees with assessment and plan at this time  Final Clinical Impressions(s) / ED Diagnoses   Final diagnoses:  Septic prepatellar bursitis of left knee    ED Discharge Orders    None       Jeanie Sewer, PA-C 01/08/19 1011    Virgina Norfolk, DO 01/08/19 1551    Virgina Norfolk, DO 01/08/19 1552

## 2019-01-08 NOTE — Progress Notes (Signed)
Notified provider of need to draw repeat lactic acid. 

## 2019-01-08 NOTE — Anesthesia Postprocedure Evaluation (Signed)
Anesthesia Post Note  Patient: Chris Lynch  Procedure(s) Performed: IRRIGATION AND DEBRIDEMENT OF PREPATELLAR LEFT KNEE (Left Knee)     Patient location during evaluation: PACU Anesthesia Type: General Level of consciousness: awake and alert Pain management: pain level controlled Vital Signs Assessment: post-procedure vital signs reviewed and stable Respiratory status: spontaneous breathing, nonlabored ventilation, respiratory function stable and patient connected to nasal cannula oxygen Cardiovascular status: blood pressure returned to baseline and stable Postop Assessment: no apparent nausea or vomiting Anesthetic complications: no    Last Vitals:  Vitals:   01/08/19 1752 01/08/19 1807  BP: (!) 141/87   Pulse: 96 100  Resp: 15   Temp: 37.2 C   SpO2: 95% 100%    Last Pain:  Vitals:   01/08/19 1752  TempSrc:   PainSc: Asleep                 Kimothy Kishimoto S

## 2019-01-09 ENCOUNTER — Encounter (HOSPITAL_COMMUNITY): Payer: Self-pay | Admitting: Orthopaedic Surgery

## 2019-01-09 DIAGNOSIS — I10 Essential (primary) hypertension: Secondary | ICD-10-CM | POA: Diagnosis present

## 2019-01-09 DIAGNOSIS — Z79899 Other long term (current) drug therapy: Secondary | ICD-10-CM

## 2019-01-09 DIAGNOSIS — E876 Hypokalemia: Secondary | ICD-10-CM

## 2019-01-09 DIAGNOSIS — M009 Pyogenic arthritis, unspecified: Secondary | ICD-10-CM

## 2019-01-09 DIAGNOSIS — K219 Gastro-esophageal reflux disease without esophagitis: Secondary | ICD-10-CM

## 2019-01-09 DIAGNOSIS — A4901 Methicillin susceptible Staphylococcus aureus infection, unspecified site: Secondary | ICD-10-CM

## 2019-01-09 DIAGNOSIS — Z7289 Other problems related to lifestyle: Secondary | ICD-10-CM

## 2019-01-09 DIAGNOSIS — Z8619 Personal history of other infectious and parasitic diseases: Secondary | ICD-10-CM

## 2019-01-09 HISTORY — DX: Essential (primary) hypertension: I10

## 2019-01-09 LAB — BASIC METABOLIC PANEL WITH GFR
Anion gap: 12 (ref 5–15)
BUN: 20 mg/dL (ref 6–20)
CO2: 21 mmol/L — ABNORMAL LOW (ref 22–32)
Calcium: 8.3 mg/dL — ABNORMAL LOW (ref 8.9–10.3)
Chloride: 101 mmol/L (ref 98–111)
Creatinine, Ser: 1.29 mg/dL — ABNORMAL HIGH (ref 0.61–1.24)
GFR calc Af Amer: >60 mL/min
GFR calc non Af Amer: >60 mL/min
Glucose, Bld: 208 mg/dL — ABNORMAL HIGH (ref 70–99)
Potassium: 4 mmol/L (ref 3.5–5.1)
Sodium: 134 mmol/L — ABNORMAL LOW (ref 135–145)

## 2019-01-09 LAB — CBC
HCT: 42.9 % (ref 39.0–52.0)
Hemoglobin: 14.5 g/dL (ref 13.0–17.0)
MCH: 30.1 pg (ref 26.0–34.0)
MCHC: 33.8 g/dL (ref 30.0–36.0)
MCV: 89.2 fL (ref 80.0–100.0)
Platelets: 191 10*3/uL (ref 150–400)
RBC: 4.81 MIL/uL (ref 4.22–5.81)
RDW: 12.6 % (ref 11.5–15.5)
WBC: 19.7 10*3/uL — ABNORMAL HIGH (ref 4.0–10.5)
nRBC: 0 % (ref 0.0–0.2)

## 2019-01-09 LAB — URINE CULTURE: Culture: NO GROWTH

## 2019-01-09 MED ORDER — FAMOTIDINE 20 MG PO TABS
20.0000 mg | ORAL_TABLET | Freq: Two times a day (BID) | ORAL | Status: DC
  Administered 2019-01-09 – 2019-01-11 (×5): 20 mg via ORAL
  Filled 2019-01-09 (×5): qty 1

## 2019-01-09 MED ORDER — MAGNESIUM HYDROXIDE 400 MG/5ML PO SUSP
15.0000 mL | Freq: Every day | ORAL | Status: DC | PRN
  Administered 2019-01-10: 20:00:00 15 mL via ORAL
  Filled 2019-01-09: qty 30

## 2019-01-09 MED ORDER — LINEZOLID 600 MG PO TABS: 600.0000 mg | ORAL_TABLET | Freq: Two times a day (BID) | ORAL | 0 refills | Status: DC

## 2019-01-09 MED ORDER — PANTOPRAZOLE SODIUM 20 MG PO TBEC
20.0000 mg | DELAYED_RELEASE_TABLET | Freq: Once | ORAL | Status: AC
  Administered 2019-01-09: 20 mg via ORAL
  Filled 2019-01-09: qty 1

## 2019-01-09 NOTE — Progress Notes (Signed)
Subjective: Chris Lynch is feeling well today.  He reports feeling much better than when he presented to the emergency room.  He complains of some bad acid reflux for which we restarted Pepcid this morning per his request.  He is unsure of what medicine he takes at home but it may be Prilosec. He has not seen a gastroenterologist or had an upper endoscopy due to lack of insurance.  He also reports heavy drinking particularly over the last 6 to 7 months which is when he says his acid reflux has been worse. He also reports a history of hypertension for which he uses Maxzide.  He is amenable to the plan of being seen by infectious disease for assistance with antibiotic narrowing.  He is also amenable to having social work involved for assistance due to lack of insurance.  Consults: Ortho and infectious disease  Objective:  Vital signs in last 24 hours: Vitals:   01/09/19 0455 01/09/19 0841 01/09/19 0845 01/09/19 1118  BP: (!) 173/98 (!) 157/92  (!) 160/89  Pulse: 84     Resp: 18 19    Temp: 98 F (36.7 C)  97.7 F (36.5 C)   TempSrc: Oral  Oral   SpO2: 98%   100%  Weight:      Height:       Physical Exam  Constitutional: He is oriented to person, place, and time. No distress.  HENT:  Head: Normocephalic and atraumatic.  Neck: Normal range of motion.  Cardiovascular: Normal rate, regular rhythm and normal heart sounds.  No murmur heard. Pulmonary/Chest: Effort normal and breath sounds normal. No respiratory distress.  Abdominal: Soft. Bowel sounds are normal. He exhibits no distension. There is no abdominal tenderness.  Musculoskeletal:     Comments: Left knee in Ace bandage without surrounding erythema  Neurological: He is alert and oriented to person, place, and time.  Skin: Skin is warm and dry. He is not diaphoretic. No erythema.  Psychiatric: Mood normal.   I/Os:  Intake/Output Summary (Last 24 hours) at 01/09/2019 1138 Last data filed at 01/09/2019 1030 Gross per 24 hour   Intake 2074.39 ml  Output 2255 ml  Net -180.61 ml   Labs: Results for orders placed or performed during the hospital encounter of 01/07/19 (from the past 24 hour(s))  HIV Antibody (routine testing w rflx)     Status: None   Collection Time: 01/08/19 11:50 AM  Result Value Ref Range   HIV Screen 4th Generation wRfx NON REACTIVE NON REACTIVE  Hemoglobin A1c     Status: None   Collection Time: 01/08/19 11:51 AM  Result Value Ref Range   Hgb A1c MFr Bld 5.6 4.8 - 5.6 %   Mean Plasma Glucose 114.02 mg/dL  Aerobic/Anaerobic Culture (surgical/deep wound)     Status: None (Preliminary result)   Collection Time: 01/08/19  5:21 PM   Specimen: PATH Benign ortho; Tissue  Result Value Ref Range   Specimen Description ABSCESS LEFT KNEE    Special Requests PREPATELLAR BURSA    Gram Stain      MODERATE WBC PRESENT,BOTH PMN AND MONONUCLEAR RARE GRAM POSITIVE COCCI Performed at [facility], [address], [place], [place] [address]    Culture FEW STAPHYLOCOCCUS AUREUS    Report Status PENDING   Lactic acid, plasma     Status: None   Collection Time: 01/08/19  6:03 PM  Result Value Ref Range   Lactic Acid, Venous 1.8 0.5 - 1.9 mmol/L  Basic metabolic  panel     Status: Abnormal   Collection Time: 01/09/19  2:55 AM  Result Value Ref Range   Sodium 134 (L) 135 - 145 mmol/L   Potassium 4.0 3.5 - 5.1 mmol/L   Chloride 101 98 - 111 mmol/L   CO2 21 (L) 22 - 32 mmol/L   Glucose, Bld 208 (H) 70 - 99 mg/dL   BUN 20 6 - 20 mg/dL   Creatinine, Ser 1.611.29 (H) 0.61 - 1.24 mg/dL   Calcium 8.3 (L) 8.9 - 10.3 mg/dL   GFR calc non Af Amer >60 >60 mL/min   GFR calc Af Amer >60 >60 mL/min   Anion gap 12 5 - 15  CBC     Status: Abnormal   Collection Time: 01/09/19  2:55 AM  Result Value Ref Range   WBC 19.7 (H) 4.0 - 10.5 K/uL   RBC 4.81 4.22 - 5.81 MIL/uL   Hemoglobin 14.5 13.0 - 17.0 g/dL   HCT 09.642.9 04.539.0 - 40.952.0 %   MCV 89.2 80.0 - 100.0 fL   MCH 30.1 26.0 - 34.0 pg   MCHC 33.8 30.0 - 36.0  g/dL   RDW 81.112.6 91.411.5 - 78.215.5 %   Platelets 191 150 - 400 K/uL   nRBC 0.0 0.0 - 0.2 %   Assessment/Plan:  Assessment: [name] is a 58 year old male with hypertension hep C s/p treatment interferon presents to the ED with signs and symptoms concerning for septic arthritis of the left knee.  Surgery was consulted and the patient is now status post I&D and IV antibiotics with decreasing white blood count without fever or chills.  Plan: Principal Problem: Septic prepatellar bursitis of left knee -Patient presented with left knee pain, erythema and swelling and other systemic signs of infection concerning for septic arthritis of the left knee for which he underwent I&D with Ortho and was started on broad-spectrum IV antibiotics  -Preliminary culture data shows gram-positive cocci -Patient afebrile with decreasing white blood count -Infectious disease consulted per orthopedic surgery recommendations  PLAN: -Discontinue ceftriaxone due to preliminary culture data -Follow-up infectious disease recommendation -Continue to monitor fever curve and white blood cell count  Possible AKI/Hypokalemia -Patient with admission creatinine of 1.6 now down to 1.3 -Unclear if this is due to underlying CKD or will continue to downtrend -From history it sounds like patient has a history of hypokalemia; this may be secondary to heavy alcohol use -It was unclear on admission that patient took hydrochlorothiazide triamterene which was restarted today -Potassium today 4.0  PLAN: -Continue to monitor creatinine and potassium with BMP -Continue hydrochlorothiazide triamterene -Encourage patient to stop drinking alcohol  Hypertension: -Patient with elevated blood pressures on admission to the 180s systolic with known history of hypertension that was previously believed to be untreated however med rec since admission suggests he takes hydrochlorothiazide triamterene which patient confirmed and was restarted  this morning  -He was also started on amlodipine 5 mg since admission -Blood pressure better controlled most recently 157/92  PLAN: -Continue hydrochlorothiazide triamterene and amlodipine -Continue to monitor blood pressure   GERD: -Patient reports a history of GERD which has become worse over the last 7 months or so -He reports being treated with Pepcid twice a day -He thinks perhaps he was also taking Prilosec -He has not seen a gastroenterologist or had an upper endoscopy as he does not have insurance -He reports he drinks heavily and acknowledges that that could contribute  PLAN: -Continue Pepcid twice daily -Starting Protonix 20 mg  daily -Encourage patient to stop drinking alcohol -Consult to case management as patient uninsured and may qualify for orange card so that he can follow-up with gastroenterology  Alcohol Use Disorder -On CIWA protocol -Reports heavy drinking and acknowledges that he likely needs to stop  PLAN: -Continue CIWA continue to encourage alcohol cessation  Dispo: Anticipated discharge in approximately 1-2 day(s).   Al Decant, MD 01/09/2019, 11:38 AM Pager: [phone]

## 2019-01-09 NOTE — Progress Notes (Signed)
Subjective: 1 Day Post-Op Procedure(s) (LRB): IRRIGATION AND DEBRIDEMENT OF PREPATELLAR LEFT KNEE (Left) Patient reports pain as mild.  Pain improving.  No fevers or chills.  Wbc trending down  Objective: Vital signs in last 24 hours: Temp:  [97.9 F (36.6 C)-99.4 F (37.4 C)] 98 F (36.7 C) (11/25 0455) Pulse Rate:  [84-110] 84 (11/25 0455) Resp:  [14-24] 18 (11/25 0455) BP: (128-173)/(74-98) 173/98 (11/25 0455) SpO2:  [94 %-100 %] 98 % (11/25 0455) Weight:  [97.5 kg] 97.5 kg (11/24 0734)  Intake/Output from previous day: 11/24 0701 - 11/25 0700 In: 2714.4 [I.V.:1114.4; IV Piggyback:1600] Out: 2280 [Urine:2230; Blood:50] Intake/Output this shift: No intake/output data recorded.  Recent Labs    01/08/19 0003 01/09/19 0255  HGB 13.4 14.5   Recent Labs    01/08/19 0003 01/09/19 0255  WBC 26.2* 19.7*  RBC 4.40 4.81  HCT 39.3 42.9  PLT 252 191   Recent Labs    01/08/19 0003 01/09/19 0255  NA 134* 134*  K 2.9* 4.0  CL 101 101  CO2 20* 21*  BUN 28* 20  CREATININE 1.59* 1.29*  GLUCOSE 154* 208*  CALCIUM 8.6* 8.3*   Recent Labs    01/08/19 0754  INR 1.2    Neurologically intact Neurovascular intact Sensation intact distally Intact pulses distally Dorsiflexion/Plantar flexion intact Incision: dressing C/D/I Compartment soft   Assessment/Plan: 1 Day Post-Op Procedure(s) (LRB): IRRIGATION AND DEBRIDEMENT OF PREPATELLAR LEFT KNEE (Left) Up with therapy  WBAT LLE Currently on rocephin/vancomycin Wbc count trending down Awaiting intra-op cx Recommend ID input for abx choice and duration Will remove iodiform packing in a few days      Aundra Dubin 01/09/2019, 7:24 AM

## 2019-01-09 NOTE — Evaluation (Addendum)
Occupational Therapy Evaluation Patient Details Name: Chris Lynch MRN: [id number] DOB: 1960/03/26 Today's Date: 01/09/2019    History of Present Illness Pt is a 58 yo male presenting s/p I&D of septic prepatellar bursitis of his L knee 11/24. The pt's only known PMH is HTN.   Clinical Impression   PTA patient independent and working. Admitted for above and limited by problem list below, including decreased reach to R LE for LB ADls, stiffness/soreness in R knee.  Patient completing LB ADLs with min assist, basic transfers with supervision, but simulated tub transfers with min guard assist.  Reviewed compensatory techniques for LB ADls and safety.  Patient will benefit from further OT services acutely in order to return to independent level with LB ADls and tub transfers. Anticipate no needs after dc home.    Follow Up Recommendations  No OT follow up    Equipment Recommendations  None recommended by OT    Recommendations for Other Services       Precautions / Restrictions Precautions Precautions: Fall Restrictions Weight Bearing Restrictions: Yes LLE Weight Bearing: Weight bearing as tolerated      Mobility Bed Mobility Overal bed mobility: Modified Independent             General bed mobility comments: OOB in chair upon entry  Transfers Overall transfer level: Needs assistance Equipment used: None Transfers: Sit to/from Stand Sit to Stand: Supervision         General transfer comment: supervision for safety and line mgmt     Balance Overall balance assessment: Needs assistance Sitting-balance support: No upper extremity supported;Feet supported Sitting balance-Leahy Scale: Good     Standing balance support: No upper extremity supported;During functional activity Standing balance-Leahy Scale: Good                             ADL either performed or assessed with clinical judgement   ADL Overall ADL's : Needs assistance/impaired      Grooming: Supervision/safety;Standing       Lower Body Bathing: Minimal assistance;Sit to/from stand       Lower Body Dressing: Minimal assistance;Sit to/from stand   Toilet Transfer: Supervision/safety;Ambulation       Tub/ Shower Transfer: Min guard;Tub transfer Tub/Shower Transfer Details (indicate cue type and reason): simulated in room Functional mobility during ADLs: Supervision/safety General ADL Comments: patient limited by decreased functional reach to R LE due to Knee stiffness- reviewed compensatory techniques for LB ADLs      Vision         Perception     Praxis      Pertinent Vitals/Pain Pain Assessment: No/denies pain     Hand Dominance Right   Extremity/Trunk Assessment Upper Extremity Assessment Upper Extremity Assessment: Overall WFL for tasks assessed   Lower Extremity Assessment Lower Extremity Assessment: Defer to PT evaluation LLE Deficits / Details: pt able to ambulate and complete transfers without difficulty, pt reports L knee ROM limited by pain   Cervical / Trunk Assessment Cervical / Trunk Assessment: Normal   Communication Communication Communication: No difficulties   Cognition Arousal/Alertness: Awake/alert Behavior During Therapy: WFL for tasks assessed/performed Overall Cognitive Status: Within Functional Limits for tasks assessed                                     General Comments  pt moves very quickly with trasnfers and  mobility, he was able to maintain without LOB, but impulsive throughout session.    Exercises     Shoulder Instructions      Home Living Family/patient expects to be discharged to:: Private residence Living Arrangements: Spouse/significant other Available Help at Discharge: Available 24 hours/day;Other (Comment) Type of Home: Mobile home Home Access: Stairs to enter Entrance Stairs-Number of Steps: 5 Entrance Stairs-Rails: Can reach both Home Layout: One level     Bathroom  Shower/Tub: Teacher, early years/pre: Standard     Home Equipment: Hand held shower head   Additional Comments: 3 grown kids, not local      Prior Functioning/Environment Level of Independence: Independent        Comments: completely independent, working Architect, driving, etc        OT Problem List: Decreased activity tolerance;Decreased range of motion;Decreased knowledge of use of DME or AE;Decreased knowledge of precautions      OT Treatment/Interventions: Self-care/ADL training;DME and/or AE instruction;Therapeutic activities;Patient/family education    OT Goals(Current goals can be found in the care plan section) Acute Rehab OT Goals Patient Stated Goal: return home and get back to clients OT Goal Formulation: With patient Time For Goal Achievement: 01/23/19 Potential to Achieve Goals: Good  OT Frequency: Min 2X/week   Barriers to D/C:            Co-evaluation              AM-PAC OT "6 Clicks" Daily Activity     Outcome Measure Help from another person eating meals?: None Help from another person taking care of personal grooming?: None Help from another person toileting, which includes using toliet, bedpan, or urinal?: None Help from another person bathing (including washing, rinsing, drying)?: A Little Help from another person to put on and taking off regular upper body clothing?: None Help from another person to put on and taking off regular lower body clothing?: A Little 6 Click Score: 22   End of Session Nurse Communication: Mobility status  Activity Tolerance: Patient tolerated treatment well Patient left: in chair;with call bell/phone within reach;with chair alarm set  OT Visit Diagnosis: Other abnormalities of gait and mobility (R26.89)                Time: 6712-4580 OT Time Calculation (min): 15 min Charges:  OT General Charges $OT Visit: 1 Visit OT Evaluation $OT Eval Low Complexity: 1 Low  Delight Stare, OT Acute  Rehabilitation Services Pager [phone] Office [phone]   Delight Stare 01/09/2019, 12:27 PM

## 2019-01-09 NOTE — Consult Note (Signed)
[facility]    Date of Admission:  01/07/2019     Total days of antibiotics 2  Vancomycin 2   Ceftriaxone 1         Reason for Consult: Septic bursitis L Knee     Referring Provider: Mikey Bussing  Primary Care Provider: Kaleen Mask, MD    Assessment: Chris Lynch is a 58 y.o. male with septic bursitis of the left prepatellar bursa due to staphylococcus aureus. He was not bacteremic at presentation based on negative to date blood cultures drawn 01/08/19. He has been properly debrided and improving on IV therapy. Would continue vancomycin alone for now and narrow after sensitivities return.  Would typically treat with 2 weeks IV followed by ~2 weeks PO antibiotics, however I am not confident after our conversation he will be successful with a PICC line. Will look into assistance for Linezolid x 4 weeks as this is highly bioavailable and he has no contraindications for use.   Dr. Luciana Axe will follow up with him tomorrow.    Plan: 1. Continue vancomycin  2. Follow micro data 3. To consider linezolid x 4 weeks    Principal Problem:   Septic prepatellar bursitis of left knee Active Problems:   Staph aureus infection   Essential hypertension   . amLODipine  5 mg Oral QHS  . famotidine  20 mg Oral BID  . folic acid  1 mg Oral Daily  . multivitamin with minerals  1 tablet Oral Daily  . sodium chloride flush  3 mL Intravenous Once  . thiamine  100 mg Oral Daily   Or  . thiamine  100 mg Intravenous Daily  . triamterene-hydrochlorothiazide  1 tablet Oral Daily    HPI: Chris Lynch is a 58 y.o. male with past medical history including HTN and Hep C cured with interferon.   Franchot presented to the ER after experiencing progressively worsening knee pain for 4 days. Last Thrusday he was crawling around on his knees working on a deck project and later Chris Lynch floor when he felt a sensation of a bug crawling on his leg but does not recall  any bite. Developed a white bump on the lateral portion of the knee later on which he popped. Saturday he developed more significant pain and swelling that progressed to significantly restricted mobility Sunday, blistering down the lateral left thigh and fever to 102 F.   He was taken to the OR for irrigation and debridement of his knee 11/24 by Dr. Roda Shutters. Reports that his pain is improving overall. Op note reviewed - bloody purulent material located within the bursa. Knee joint was aspirated revealing no concern for septic arthritis.  Major complaint currently is financial coordination and associated stress as well as some heartburn.   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 He drinks 2 pints of liquor a week, mostly over 2 days. He has no health insurance.    Review of Systems: Review of Systems  Constitutional: Negative for chills and fever.  HENT: Negative for tinnitus.   Eyes: Negative for blurred vision and photophobia.  Respiratory: Negative for cough and sputum production.   Cardiovascular: Negative for chest pain.  Gastrointestinal: Positive for heartburn. Negative for diarrhea, nausea and vomiting.  Genitourinary: Negative for dysuria.  Musculoskeletal: Positive for joint pain.  Skin: Negative for rash (blistering on left lateral thigh).  Neurological: Negative for headaches.    Past Medical History:  Diagnosis Date  . 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 Hypertension     Social History   Tobacco Use  . Smoking status: Former Games developer  . Smokeless tobacco: Never Used  Substance Use Topics  . Alcohol use: Yes    Comment: 2 pints a week of liquor  . 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 Heart attack Father      Allergies  Allergen  Reactions  . Ace Inhibitors Other (See Comments)    Pt said it made him feel sick, lethargic    OBJECTIVE: Blood pressure (!) 157/82, pulse 95, temperature 97.7 F (36.5 C), temperature source Oral, resp. rate 20, height  (1.702 m), weight 97.5 kg, SpO2 99 %.  Physical Exam Constitutional:      Appearance: He is well-developed.     Comments: Seated comfortably in recliner eating breakfast.   HENT:     Mouth/Throat:     Mouth: Mucous membranes are moist.     Dentition: Normal dentition. No dental abscesses.     Pharynx: Oropharynx is clear.  Cardiovascular:     Rate and Rhythm: Normal rate and regular rhythm.     Heart sounds: Normal heart sounds.  Pulmonary:     Effort: Pulmonary effort is normal.     Breath sounds: Normal breath sounds.  Abdominal:     General: There is no distension.     Palpations: Abdomen is soft.     Tenderness: There is no abdominal tenderness.  Musculoskeletal:     Comments: Left knee is dressed in clean/dry ACE dressing. +distal sensation with good pulses.   Lymphadenopathy:     Cervical: No cervical adenopathy.  Skin:    General: Skin is warm and dry.     Capillary Refill: Capillary refill takes less than 2 seconds.     Findings: No rash.  Neurological:     Mental Status: He is alert and oriented to person, place, and time.  Psychiatric:        Judgment: Judgment normal.     Comments: In good spirits today and engaged in care discussion.      Lab Results Lab Results  Component Value Date   WBC 19.7 (H) 01/09/2019   HGB 14.5 01/09/2019   HCT 42.9 01/09/2019   MCV 89.2 01/09/2019   PLT 191 01/09/2019    Lab Results  Component Value Date   CREATININE 1.29 (H) 01/09/2019   BUN 20 01/09/2019   NA 134 (L) 01/09/2019   K 4.0 01/09/2019   CL 101 01/09/2019   CO2 21 (L) 01/09/2019    Lab Results  Component Value Date   ALT 25 01/08/2019   AST 23 01/08/2019   ALKPHOS 58 01/08/2019   BILITOT 1.1 01/08/2019     Microbiology:  Recent Results (from the past 240 hour(s))  Blood Culture (routine x 2)     Status: None (Preliminary result)   Collection Time: 01/08/19  7:00 AM   Specimen: BLOOD RIGHT ARM  Result Value Ref Range Status   Specimen Description BLOOD RIGHT ARM  Final   Special Requests   Final    BOTTLES DRAWN AEROBIC AND ANAEROBIC Blood Culture adequate volume   Culture   Final  NO GROWTH 1 DAY Performed at [facility], [place] [address], [place], [place] [address]    Report Status PENDING  Incomplete  Body fluid culture     Status: None (Preliminary result)   Collection Time: 01/08/19  8:59 AM   Specimen: KNEE; Body Fluid  Result Value Ref Range Status   Specimen Description KNEE  Final   Special Requests NONE  Final   Gram Stain   Final    ABUNDANT WBC PRESENT,BOTH PMN AND MONONUCLEAR RARE GRAM POSITIVE COCCI    Culture   Final    ABUNDANT STAPHYLOCOCCUS AUREUS SUSCEPTIBILITIES TO FOLLOW Performed at [facility], [place] [address], [place], [place] [address]    Report Status PENDING  Incomplete  SARS Coronavirus 2 by RT PCR (hospital order, performed in [facility] hospital lab) Nasopharyngeal Nasopharyngeal Swab     Status: None   Collection Time: 01/08/19  9:00 AM   Specimen: Nasopharyngeal Swab  Result Value Ref Range Status   SARS Coronavirus 2 NEGATIVE NEGATIVE Final    Comment: (NOTE) SARS-CoV-2 target nucleic acids are NOT DETECTED. The SARS-CoV-2 RNA is generally detectable in upper and lower respiratory specimens during the acute phase of infection. The lowest concentration of SARS-CoV-2 viral copies this assay can detect is 250 copies / mL. A negative result does not preclude SARS-CoV-2 infection and should not be used as the sole basis for treatment or other patient management decisions.  A negative result may occur with improper specimen collection / handling, submission of specimen other than nasopharyngeal swab, presence of viral mutation(s) within the areas  targeted by this assay, and inadequate number of viral copies (<250 copies / mL). A negative result must be combined with clinical observations, patient history, and epidemiological information. Fact Sheet for Patients:   StrictlyIdeas.no Fact Sheet for Healthcare Providers: BankingDealers.co.za This test is not yet approved or cleared  by the [place] FDA and has been authorized for detection and/or diagnosis of SARS-CoV-2 by FDA under an Emergency Use Authorization (EUA).  This EUA will remain in effect (meaning this test can be used) for the duration of the COVID-19 declaration under Section 564(b)(1) of the Act, 21 U.S.C. section 360bbb-3(b)(1), unless the authorization is terminated or revoked sooner. Performed at [facility], [place] [address], [place], [place] [address]   Blood Culture (routine x 2)     Status: None (Preliminary result)   Collection Time: 01/08/19  9:07 AM   Specimen: BLOOD LEFT HAND  Result Value Ref Range Status   Specimen Description BLOOD LEFT HAND  Final   Special Requests   Final    BOTTLES DRAWN AEROBIC AND ANAEROBIC Blood Culture adequate volume   Culture   Final    NO GROWTH 1 DAY Performed at [facility], [place] [address] [place], [place] [address]    Report Status PENDING  Incomplete  Urine culture     Status: None   Collection Time: 01/08/19  9:10 AM   Specimen: In/Out Cath Urine  Result Value Ref Range Status   Specimen Description IN/OUT CATH URINE  Final   Special Requests NONE  Final   Culture   Final    NO GROWTH Performed at [facility], [place] [address] [place], [place] [address]    Report Status 01/09/2019 FINAL  Final  Aerobic/Anaerobic Culture (surgical/deep wound)     Status: None (Preliminary result)   Collection Time: 01/08/19  5:21 PM   Specimen: PATH  Benign ortho; Tissue  Result Value Ref Range Status   Specimen Description ABSCESS LEFT KNEE  Final    Special Requests PREPATELLAR BURSA  Final   Gram Stain   Final    MODERATE WBC PRESENT,BOTH PMN AND MONONUCLEAR RARE GRAM POSITIVE COCCI Performed at [facility], [address] [place], [place] [address]    Culture FEW STAPHYLOCOCCUS AUREUS  Final   Report Status PENDING  Incomplete     Rexene AlbertsStephanie Jaqueline Uber, MSN, NP-C [facility]  [email or web address] Pager: [phone] Office: [phone] [facility] Main Line: [phone]   01/09/2019 2:41 PM

## 2019-01-09 NOTE — Progress Notes (Signed)
   01/09/19 0455  Vitals  BP (!) 173/98  MAP (mmHg) 121   Pt hypertensive this AM see above; pt is resting and denies complaints. On-call resident notified and states we will watch for now. Will continue to monitor.

## 2019-01-09 NOTE — Evaluation (Signed)
Physical Therapy Evaluation Patient Details Name: Chris Lynch MRN: [id number] DOB: 08-23-1960 Today's Date: 01/09/2019   History of Present Illness  Pt is a 58 yo male presenting s/p I&D of septic prepatellar bursitis of his L knee 11/24. The pt's only known PMH is HTN.  Clinical Impression  Pt in bed upon PT arrival, agreeable to session. Pt demos some limited mobility of L knee due to reports of stiffness and soreness with ROM, but denied pain through PT session. Pt was able to demo good bed mobility and transfers with supervision for line management, but moves quickly and benefits from VCs to move more slowly for increased safety. Pt also demos good mobility and ambulated both with and without RW with no LOB, and navigated 5 steps with use of 1 rail and min guard. Pt will continue to benefit from skilled PT to address safety with mobility and mobility of LLE prior to d/c home.     Follow Up Recommendations Home health PT;Supervision/Assistance - 24 hour    Equipment Recommendations  None recommended by PT    Recommendations for Other Services       Precautions / Restrictions Precautions Precautions: Fall Restrictions Weight Bearing Restrictions: Yes LLE Weight Bearing: Weight bearing as tolerated      Mobility  Bed Mobility Overal bed mobility: Modified Independent             General bed mobility comments: minimal supervision for lines only, pt moves quickly.  Transfers Overall transfer level: Modified independent Equipment used: Rolling walker (2 wheeled);None             General transfer comment: Pt able to move to EOB and complete sit-stand trasnfers with minimal supervision for lines only, pt moves quickly.  Ambulation/Gait Ambulation/Gait assistance: Supervision Gait Distance (Feet): 150 Feet(x2) Assistive device: Rolling walker (2 wheeled);None Gait Pattern/deviations: Step-to pattern;Decreased step length - right;Decreased stance time - left Gait  velocity: 0.4 m/s Gait velocity interpretation: <1.8 ft/sec, indicate of risk for recurrent falls General Gait Details: Pt ambulated 150 ft with RW before pushing the RW away and insiting he can ambulate without it. Pt then completed another 150 ft without an AD. No LOB, slight increase in lateral movement, improved with mobility.  Stairs Stairs: Yes Stairs assistance: Min guard Stair Management: One rail Right Number of Stairs: 5 General stair comments: pt navigated stairs with no LOB, use of R rail, good management of IV line  Wheelchair Mobility    Modified Rankin (Stroke Patients Only)       Balance Overall balance assessment: Needs assistance Sitting-balance support: Feet supported Sitting balance-Leahy Scale: Normal     Standing balance support: No upper extremity supported;During functional activity Standing balance-Leahy Scale: Good                               Pertinent Vitals/Pain Pain Assessment: No/denies pain    Home Living Family/patient expects to be discharged to:: Private residence Living Arrangements: Spouse/significant other(girlfriend) Available Help at Discharge: Available 24 hours/day;Other (Comment)(girlfriend not working currently) Type of Home: Mobile home Home Access: Stairs to enter Entrance Stairs-Rails: Can reach both Entrance Stairs-Number of Steps: 5 Home Layout: One level Home Equipment: Hand held shower head Additional Comments: 3 grown kids, not local    Prior Function Level of Independence: Independent         Comments: completely independent, working Architect, driving, Comptroller  Dominant Hand: Right    Extremity/Trunk Assessment   Upper Extremity Assessment Upper Extremity Assessment: Overall WFL for tasks assessed    Lower Extremity Assessment Lower Extremity Assessment: Overall WFL for tasks assessed;LLE deficits/detail LLE Deficits / Details: pt able to ambulate and complete  transfers without difficulty, pt reports L knee ROM limited by pain    Cervical / Trunk Assessment Cervical / Trunk Assessment: Normal  Communication   Communication: No difficulties  Cognition Arousal/Alertness: Awake/alert Behavior During Therapy: WFL for tasks assessed/performed Overall Cognitive Status: Within Functional Limits for tasks assessed                                        General Comments General comments (skin integrity, edema, etc.): pt moves very quickly with trasnfers and mobility, he was able to maintain without LOB, but impulsive throughout session.    Exercises     Assessment/Plan    PT Assessment Patient needs continued PT services  PT Problem List Decreased range of motion;Decreased strength;Decreased mobility;Decreased safety awareness;Decreased activity tolerance;Decreased balance       PT Treatment Interventions Stair training;Gait training;Therapeutic exercise;Balance training;Functional mobility training;Patient/family education    PT Goals (Current goals can be found in the Care Plan section)  Acute Rehab PT Goals Patient Stated Goal: return home and get back to clients PT Goal Formulation: With patient Time For Goal Achievement: 01/23/19 Potential to Achieve Goals: Good    Frequency Min 2X/week   Barriers to discharge        Co-evaluation               AM-PAC PT "6 Clicks" Mobility  Outcome Measure Help needed turning from your back to your side while in a flat bed without using bedrails?: None Help needed moving from lying on your back to sitting on the side of a flat bed without using bedrails?: None Help needed moving to and from a bed to a chair (including a wheelchair)?: A Little Help needed standing up from a chair using your arms (e.g., wheelchair or bedside chair)?: A Little Help needed to walk in hospital room?: A Little Help needed climbing 3-5 steps with a railing? : A Little 6 Click Score: 20     End of Session Equipment Utilized During Treatment: Gait belt Activity Tolerance: Patient tolerated treatment well;No increased pain Patient left: in chair;with call bell/phone within reach;with chair alarm set;with nursing/sitter in room Nurse Communication: Mobility status PT Visit Diagnosis: Other abnormalities of gait and mobility (R26.89);Difficulty in walking, not elsewhere classified (R26.2)    Time: 0935-1006 PT Time Calculation (min) (ACUTE ONLY): 31 min   Charges:   PT Evaluation $PT Eval Low Complexity: 1 Low PT Treatments $Gait Training: 8-22 mins        Metro Kung, PT, DPT   Acute Rehabilitation Department [phone]  Gaetana Michaelis 01/09/2019, 11:27 AM

## 2019-01-10 LAB — BODY FLUID CULTURE

## 2019-01-10 LAB — CBC
HCT: 34.8 % — ABNORMAL LOW (ref 39.0–52.0)
Hemoglobin: 11.7 g/dL — ABNORMAL LOW (ref 13.0–17.0)
MCH: 30 pg (ref 26.0–34.0)
MCHC: 33.6 g/dL (ref 30.0–36.0)
MCV: 89.2 fL (ref 80.0–100.0)
Platelets: 293 10*3/uL (ref 150–400)
RBC: 3.9 MIL/uL — ABNORMAL LOW (ref 4.22–5.81)
RDW: 12.6 % (ref 11.5–15.5)
WBC: 24.4 10*3/uL — ABNORMAL HIGH (ref 4.0–10.5)
nRBC: 0 % (ref 0.0–0.2)

## 2019-01-10 LAB — BASIC METABOLIC PANEL
Anion gap: 7 (ref 5–15)
BUN: 21 mg/dL — ABNORMAL HIGH (ref 6–20)
CO2: 28 mmol/L (ref 22–32)
Calcium: 8.7 mg/dL — ABNORMAL LOW (ref 8.9–10.3)
Chloride: 104 mmol/L (ref 98–111)
Creatinine, Ser: 1.16 mg/dL (ref 0.61–1.24)
GFR calc Af Amer: >60 mL/min (ref >60)
GFR calc non Af Amer: >60 mL/min (ref >60)
Glucose, Bld: 146 mg/dL — ABNORMAL HIGH (ref 70–99)
Potassium: 4.4 mmol/L (ref 3.5–5.1)
Sodium: 139 mmol/L (ref 135–145)

## 2019-01-10 MED ORDER — VANCOMYCIN HCL 10 G IV SOLR
1500.0000 mg | INTRAVENOUS | Status: DC
  Administered 2019-01-11: 1500 mg via INTRAVENOUS
  Filled 2019-01-10: qty 1500

## 2019-01-10 NOTE — Progress Notes (Signed)
Pharmacy Antibiotic Note  Chris Lynch is a 58 y.o. male admitted on 01/07/2019 with cellulitis.  Pharmacy has been consulted for vancomycin dosing.   Pt is afebrile and WBC is elevated at 24.4. Renal function improving with Scr 1.16, making 1.4L uop yesterday. Will increase dose of vancomycin today given improvement in renal function. ID is following and planning 4 weeks of linezolid at discharge.   Plan: Increase Vancomycin to 1500 mg IV Q24 hrs Plan 4 weeks of Linezolid at discharge F/u renal fxn, C&S, clinical status and peak/trough at SS  Height: 5\' 7"  (170.2 cm) Weight: 215 lb (97.5 kg) IBW/kg (Calculated) : 66.1  Temp (24hrs), Avg:97.9 F (36.6 C), Min:97.5 F (36.4 C), Max:98.1 F (36.7 C)  Recent Labs  Lab 01/08/19 0003 01/08/19 0754 01/08/19 1803 01/09/19 0255 01/10/19 0649  WBC 26.2*  --   --  19.7* 24.4*  CREATININE 1.59*  --   --  1.29* 1.16  LATICACIDVEN 1.6 2.4* 1.8  --   --     Estimated Creatinine Clearance: 78.2 mL/min (by C-G formula based on SCr of 1.16 mg/dL).    Allergies  Allergen Reactions  . Ace Inhibitors Other (See Comments)    Pt said it made him feel sick, lethargic    Antimicrobials this admission: Vanc 11/24>> CTX x 1 11/24*  Dose adjustments this admission: Scr 1.59 > 1.16: estimated Vancomycin AUC with 1g Q24 hrs = 348 > increase dose to 1500 mg IV Q24 hrs  Microbiology results: 11/24 UCx: neg 11/24 BCx: ngtd 11/24 knee fluid: Staph Aureus (R: cipro, erythro) 11/24 tissue knee abscess: staph aureus (R: cipro, erythro)  Richardine Service, PharmD PGY1 Pharmacy Resident Phone: [phone] 01/10/2019  12:02 PM  Please check [email or web address] for unit-specific pharmacy phone numbers.

## 2019-01-10 NOTE — Progress Notes (Signed)
Subjective: Chris Lynch is feeling well today.  He reports much improvement in his symptoms since admission.  He continues to have some leg pain up into his thigh.  It is much improved from before at which point he could not bear for it to even be touched.  His acid reflux is improved since starting Pepcid.  Consults: Ortho and infectious disease  Objective:  Vital signs in last 24 hours: Vitals:   01/10/19 0421 01/10/19 0433 01/10/19 0635 01/10/19 0901  BP: (!) 172/110 (!) 180/103 (!) 159/98   Pulse: 85   (!) 106  Resp: 16   17  Temp: 98.1 F (36.7 C)   98 F (36.7 C)  TempSrc: Oral   Oral  SpO2: 100%   100%  Weight:      Height:       Physical Exam  Constitutional: He is oriented to person, place, and time. No distress.  HENT:  Head: Normocephalic and atraumatic.  Neck: Normal range of motion.  Cardiovascular: Normal rate, regular rhythm and normal heart sounds.  No murmur heard. Pulmonary/Chest: Effort normal and breath sounds normal. No respiratory distress.  Abdominal: Soft. Bowel sounds are normal. He exhibits no distension. There is no abdominal tenderness.  Musculoskeletal:     Comments: Left knee in Ace bandage without surrounding erythema  Neurological: He is alert and oriented to person, place, and time.  Normal strength and sensation left lower extremity  Skin: Skin is warm and dry. He is not diaphoretic. No erythema.   I/Os:  Intake/Output Summary (Last 24 hours) at 01/10/2019 1058 Last data filed at 01/10/2019 0300 Gross per 24 hour  Intake 3039.97 ml  Output 650 ml  Net 2389.97 ml   Labs: Results for orders placed or performed during the hospital encounter of 01/07/19 (from the past 24 hour(s))  Basic metabolic panel     Status: Abnormal   Collection Time: 01/10/19  6:49 AM  Result Value Ref Range   Sodium 139 135 - 145 mmol/L   Potassium 4.4 3.5 - 5.1 mmol/L   Chloride 104 98 - 111 mmol/L   CO2 28 22 - 32 mmol/L   Glucose, Bld 146 (H) 70 - 99  mg/dL   BUN 21 (H) 6 - 20 mg/dL   Creatinine, Ser 1.16 0.61 - 1.24 mg/dL   Calcium 8.7 (L) 8.9 - 10.3 mg/dL   GFR calc non Af Amer >60 >60 mL/min   GFR calc Af Amer >60 >60 mL/min   Anion gap 7 5 - 15  CBC     Status: Abnormal   Collection Time: 01/10/19  6:49 AM  Result Value Ref Range   WBC 24.4 (H) 4.0 - 10.5 K/uL   RBC 3.90 (L) 4.22 - 5.81 MIL/uL   Hemoglobin 11.7 (L) 13.0 - 17.0 g/dL   HCT 34.8 (L) 39.0 - 52.0 %   MCV 89.2 80.0 - 100.0 fL   MCH 30.0 26.0 - 34.0 pg   MCHC 33.6 30.0 - 36.0 g/dL   RDW 12.6 11.5 - 15.5 %   Platelets 293 150 - 400 K/uL   nRBC 0.0 0.0 - 0.2 %   Assessment/Plan:  Assessment: Chris Lynch is a 58 year old male with hypertension, hep C s/p treatment with interferon and GERD who presents to the ED with signs and symptoms concerning for septic arthritis of the left knee.  Surgery was consulted and the patient is now status post I&D on IV antibiotics without fever or chills.  Plan: Principal Problem:  Septic prepatellar bursitis of left knee -Patient presented with left knee pain, erythema and swelling and other systemic signs of infection concerning for septic arthritis of the left knee for which he underwent I&D with Ortho and was started on broad-spectrum IV antibiotics  -Preliminary culture data shows gram-positive cocci thus ceftriaxone was DC'd; patient continued on vancomycin with plan for treatment with Linezolid per infectious disease who states ideal would be to treat with 2 weeks IV linezolid and 2 weeks oral linezolid however patient not amenable to PICC line so likely will use 4 weeks oral linezolid -We will ensure no growth on blood cultures prior to discharge because if blood cultures positive will need work-up for endocarditis -Afebrile without infectious symptoms however white blood count did increase slightly overnight but given other signs of infection are negative we will continue to monitor  PLAN: -Continue vancomycin in the hospital  -Follow-up infectious disease recommendation -Follow-up blood cultures -Continue to monitor fever curve and white blood count  Hypertension: -Patient with elevated blood pressures on admission to the 180s systolic and 100s diastolic with known history of hypertension treated with hydrochlorothiazide triamterene  -He was also started on amlodipine 5 mg since admission -Blood pressure somewhat better controlled most recently 159/98; amlodipine can take a few days to work -Patient reports sensitivity to some blood pressure meds especially ACE inhibitors and is anxious about having the addition of amlodipine; we discussed that he does not have to continue it if he does not want to -Patient said he was not feeling sick and would continue at this time  PLAN: -Continue hydrochlorothiazide triamterene and amlodipine -Continue to monitor blood pressure   GERD: -Patient reports a history of GERD which has become worse over the last 7 months or so when his drinking has also increased -He reports being treated with Pepcid twice a day which has improved his symptoms since initiation here in the hospital -Also taking Protonix daily -Case management unable to help with patient obtaining access to gastroenterologist for possible upper endoscopy; states he will need a referral from his primary care doctor  PLAN: -Continue Pepcid twice daily -Continue Protonix 20 mg daily   Alcohol Use Disorder -On CIWA protocol -Reports heavy drinking and acknowledges that he likely needs to stop  PLAN: -Continue CIWA continue to encourage alcohol cessation  Dispo: Anticipated discharge in approximately 1-2 day(s).   Jenell Milliner, MD 01/10/2019, 10:58 AM Pager: [phone]

## 2019-01-11 DIAGNOSIS — D72829 Elevated white blood cell count, unspecified: Secondary | ICD-10-CM

## 2019-01-11 DIAGNOSIS — Z888 Allergy status to other drugs, medicaments and biological substances status: Secondary | ICD-10-CM

## 2019-01-11 DIAGNOSIS — B9562 Methicillin resistant Staphylococcus aureus infection as the cause of diseases classified elsewhere: Secondary | ICD-10-CM

## 2019-01-11 DIAGNOSIS — M7118 Other infective bursitis, other site: Secondary | ICD-10-CM

## 2019-01-11 LAB — CBC
HCT: 39.8 % (ref 39.0–52.0)
Hemoglobin: 13.2 g/dL (ref 13.0–17.0)
MCH: 29.6 pg (ref 26.0–34.0)
MCHC: 33.2 g/dL (ref 30.0–36.0)
MCV: 89.2 fL (ref 80.0–100.0)
Platelets: 364 10*3/uL (ref 150–400)
RBC: 4.46 MIL/uL (ref 4.22–5.81)
RDW: 12.6 % (ref 11.5–15.5)
WBC: 16.2 10*3/uL — ABNORMAL HIGH (ref 4.0–10.5)
nRBC: 0 % (ref 0.0–0.2)

## 2019-01-11 MED ORDER — LINEZOLID 600 MG PO TABS: 600.0000 mg | ORAL_TABLET | Freq: Two times a day (BID) | ORAL | 0 refills | Status: AC

## 2019-01-11 MED ORDER — OXYCODONE HCL 5 MG PO TABS: 5.0000 mg | ORAL_TABLET | ORAL | 0 refills | Status: DC | PRN

## 2019-01-11 NOTE — Discharge Instructions (Signed)
You were admitted to the hospital with an infection in your left knee. You underwent surgery for clean out of your knee joint which was successful. You were treated with 4 days of IV antibiotics in the hospital. We checked for bacteria in your blood and did not find any. You are being discharged on 4 weeks of oral antibiotics. You can keep your bandage on your left knee until your orthopedics follow up and further instructions will be given at that time. Please follow up with your primary care doctor within 1 week and with orthopedics within 1 week. Your primary care doctor will address your high blood pressure further at that time.  Orthopedics instructions: Increase your activities as comfort allows. You may put all of your weight on your leg as tolerated. Bend your knee as comfort allows. Keep incision clean and dry. New dry dressing as needed.

## 2019-01-11 NOTE — Progress Notes (Signed)
Subjective: Chris Lynch is feeling well today.  He reports continued improvement in his symptoms since admission.  He continues to have some leg pain up into his thigh.  It is much improved from before. His acid reflux is improved as well. He was concerned about the possibility of infection in his blood but we discussed that nothing had been found in his blood. He is also concerned about his finances and his situation as he is uninsured. He is anxious about paying for this hospital stay and affording the required follow up appointments afterwards. We discussed that we would have a social worker come to talk to him about his financial concerns.  Consults: Ortho and infectious disease  Objective:  Vital signs in last 24 hours: Vitals:   01/10/19 0901 01/10/19 1930 01/10/19 2349 01/11/19 0509  BP:  (!) 177/96 (!) 172/93 (!) 162/99  Pulse: (!) 106 86 79 88  Resp: 17  18 16   Temp: 98 F (36.7 C) 97.6 F (36.4 C) 98.1 F (36.7 C) 97.9 F (36.6 C)  TempSrc: Oral Oral Oral Oral  SpO2: 100% 100% 99% 100%  Weight:      Height:       Physical Exam  Constitutional: He is oriented to person, place, and time. No distress.  HENT:  Head: Normocephalic and atraumatic.  Neck: Normal range of motion.  Cardiovascular: Normal rate, regular rhythm and normal heart sounds.  No murmur heard. Pulmonary/Chest: Effort normal and breath sounds normal. No respiratory distress.  Abdominal: Soft. Bowel sounds are normal. He exhibits no distension. There is no abdominal tenderness.  Musculoskeletal:     Comments: Left knee in clean and dry Ace bandage without surrounding erythema  Neurological: He is alert and oriented to person, place, and time.  Normal strength and sensation of left lower extremity  Skin: Skin is warm and dry. No rash noted. He is not diaphoretic. No erythema.   I/Os:  Intake/Output Summary (Last 24 hours) at 01/11/2019 0946 Last data filed at 01/11/2019 0400 Gross per 24 hour   Intake 3049.24 ml  Output -  Net 3049.24 ml   Labs: No results found for this or any previous visit (from the past 24 hour(s)). Assessment/Plan:  Assessment: Chris Lynch is a 58 year old male with hypertension, hep C s/p treatment with interferon and GERD who presents to the ED with signs and symptoms concerning for septic arthritis of the left knee secondary to staph aureus s/p I&D on IV antibiotics with resolution of fever and improvement in pain.   Plan: Principal Problem: Septic prepatellar bursitis of left knee -Patient with left knee septic arthritis from staph on IV vanc with plan to discharge on 4 weeks of oral linezolid -blood cx no growth so far  -Continues to be afebrile without infectious symptoms  -ID and ortho consulted  PLAN: -Follow up with ID about antibiotic discharge plan and pending their recs discharge on 4 weeks linezolid from [facility] pharmacy  -follow up with ortho about wound care on discharge and fu appointments  Hypertension: -Patient with known hypertension on hydrochlorothiazide triamterene with elevated blood pressures in the hospital for which amlodipine 5 mg was added with minor affect so far -patient asymptomatic -most recent BP 162/99  PLAN: -Continue hydrochlorothiazide triamterene and amlodipine -Continue to monitor blood pressure -discharge with close PCP follow up for hospital follow up and to discuss BP control    GERD: -Patient reports a history of GERD which has become worse over the last 7 months or  so when his drinking has also increased -He reports being treated with Pepcid twice a day which has improved his symptoms since initiation here in the hospital -We added protonix 20 mg daily with improvement of his sx compared to outpatient   PLAN: -Continue Pepcid twice daily -Continue Protonix 20 mg daily   Alcohol Use Disorder -On CIWA protocol -Reports heavy drinking and acknowledges that he likely needs to stop  PLAN: -Continue  CIWA continue to encourage alcohol cessation   Financial concerns: -patient concerned about cost of hospital stay and requisite follow up appointments in setting of being uninsured  Plan: -consulted SW  Dispo: Anticipated discharge in approximately 1-2 day(s).   Al Decant, MD 01/11/2019, 9:46 AM Pager: [phone]

## 2019-01-11 NOTE — Progress Notes (Signed)
Pt wants to speak with Social Work on insurance and outpatient care.

## 2019-01-11 NOTE — Progress Notes (Signed)
Physical Therapy Treatment Patient Details Name: Chris Lynch MRN: [id number] DOB: Jul 12, 1960 Today's Date: 01/11/2019    History of Present Illness Pt is a 58 yo male presenting s/p I&D of septic prepatellar bursitis of his L knee 11/24. The pt's only known PMH is HTN.    PT Comments    Pt making excellent progress with functional mobility, requiring less assistance and with less pain compared to previous session. Pt instructed in continued LE therex to promote L knee flexion and extension throughout the day and upon d/c. Pt expressed and demonstrated understanding. PT will continue to follow pt acutely as per PT POC.    Follow Up Recommendations  Supervision - Intermittent     Equipment Recommendations  None recommended by PT    Recommendations for Other Services       Precautions / Restrictions Precautions Precautions: Fall Restrictions Weight Bearing Restrictions: Yes LLE Weight Bearing: Weight bearing as tolerated    Mobility  Bed Mobility Overal bed mobility: Modified Independent                Transfers Overall transfer level: Modified independent Equipment used: None                Ambulation/Gait Ambulation/Gait assistance: Supervision Gait Distance (Feet): 250 Feet Assistive device: None Gait Pattern/deviations: Step-through pattern Gait velocity: able to fluctuate   General Gait Details: pt very steady overall ambulating in hallway and navigating around in his room without an AD or UE supports; no LOB or need for physical assistance   Stairs             Wheelchair Mobility    Modified Rankin (Stroke Patients Only)       Balance Overall balance assessment: Needs assistance Sitting-balance support: Feet supported Sitting balance-Leahy Scale: Good     Standing balance support: During functional activity;No upper extremity supported Standing balance-Leahy Scale: Good                              Cognition  Arousal/Alertness: Awake/alert Behavior During Therapy: WFL for tasks assessed/performed Overall Cognitive Status: Within Functional Limits for tasks assessed                                        Exercises Total Joint Exercises Knee Flexion: Strengthening;Left;10 reps;Seated;Supine General Exercises - Lower Extremity Long Arc Quad: 10 reps;Seated;Strengthening;Left    General Comments        Pertinent Vitals/Pain Pain Assessment: Faces Faces Pain Scale: Hurts little more Pain Location: L lateral thigh and knee with flexion Pain Descriptors / Indicators: Guarding;Sore Pain Intervention(s): Monitored during session;Repositioned    Home Living                      Prior Function            PT Goals (current goals can now be found in the care plan section) Acute Rehab PT Goals PT Goal Formulation: With patient Time For Goal Achievement: 01/23/19 Potential to Achieve Goals: Good Progress towards PT goals: Progressing toward goals    Frequency    Min 2X/week      PT Plan Current plan remains appropriate    Co-evaluation              AM-PAC PT "6 Clicks" Mobility   Outcome Measure  Help  needed turning from your back to your side while in a flat bed without using bedrails?: None Help needed moving from lying on your back to sitting on the side of a flat bed without using bedrails?: None Help needed moving to and from a bed to a chair (including a wheelchair)?: None Help needed standing up from a chair using your arms (e.g., wheelchair or bedside chair)?: None Help needed to walk in hospital room?: None Help needed climbing 3-5 steps with a railing? : A Little 6 Click Score: 23    End of Session   Activity Tolerance: Patient tolerated treatment well Patient left: in bed;with call bell/phone within reach Nurse Communication: Mobility status PT Visit Diagnosis: Other abnormalities of gait and mobility (R26.89);Difficulty in  walking, not elsewhere classified (R26.2)     Time: 9528-4132 PT Time Calculation (min) (ACUTE ONLY): 19 min  Charges:  $Gait Training: 8-22 mins                     Anastasio Champion, DPT  Acute Rehabilitation Services Pager [phone] Office [place] 01/11/2019, 11:39 AM

## 2019-01-11 NOTE — Progress Notes (Signed)
[facility]   Reason for visit: Follow up on septic bursitis  Interval History: feels better, has remained afebrile.  WBC 24.4.  No new issues.  Blood cultures have remained negative.    Day 4 total antibiotics (vancomycin)  Physical Exam: Constitutional:  Vitals:   01/10/19 2349 01/11/19 0509  BP: (!) 172/93 (!) 162/99  Pulse: 79 88  Resp: 18 16  Temp: 98.1 F (36.7 C) 97.9 F (36.6 C)  SpO2: 99% 100%   patient appears in NAD Respiratory: Normal respiratory effort; CTA B Cardiovascular: RRR GI: soft, nt, nd MS: left leg wrapped Skin: clear blisters on lower part of leg near ankle, no erythema  Review of Systems: Constitutional: negative for fevers and chills Respiratory: negative for cough or sputum Gastrointestinal: negative for nausea  Lab Results  Component Value Date   WBC 24.4 (H) 01/10/2019   HGB 11.7 (L) 01/10/2019   HCT 34.8 (L) 01/10/2019   MCV 89.2 01/10/2019   PLT 293 01/10/2019    Lab Results  Component Value Date   CREATININE 1.16 01/10/2019   BUN 21 (H) 01/10/2019   NA 139 01/10/2019   K 4.4 01/10/2019   CL 104 01/10/2019   CO2 28 01/10/2019    Lab Results  Component Value Date   ALT 25 01/08/2019   AST 23 01/08/2019   ALKPHOS 58 01/08/2019     Microbiology: Recent Results (from the past 240 hour(s))  Blood Culture (routine x 2)     Status: None (Preliminary result)   Collection Time: 01/08/19  7:00 AM   Specimen: BLOOD RIGHT ARM  Result Value Ref Range Status   Specimen Description BLOOD RIGHT ARM  Final   Special Requests   Final    BOTTLES DRAWN AEROBIC AND ANAEROBIC Blood Culture adequate volume   Culture   Final    NO GROWTH 2 DAYS Performed at [facility], [address], [place], [place] [address]    Report Status PENDING  Incomplete  Body fluid culture     Status: None   Collection Time: 01/08/19  8:59 AM   Specimen: KNEE; Body Fluid  Result Value Ref Range Status   Specimen  Description KNEE  Final   Special Requests NONE  Final   Gram Stain   Final    ABUNDANT WBC PRESENT,BOTH PMN AND MONONUCLEAR RARE GRAM POSITIVE COCCI Performed at [facility], [address], [place], [place] [address]    Culture ABUNDANT STAPHYLOCOCCUS AUREUS  Final   Report Status 01/10/2019 FINAL  Final   Organism ID, Bacteria STAPHYLOCOCCUS AUREUS  Final      Susceptibility   Staphylococcus aureus - MIC*    CIPROFLOXACIN >=8 RESISTANT Resistant     ERYTHROMYCIN >=8 RESISTANT Resistant     GENTAMICIN <=0.5 SENSITIVE Sensitive     OXACILLIN 0.5 SENSITIVE Sensitive     TETRACYCLINE <=1 SENSITIVE Sensitive     VANCOMYCIN <=0.5 SENSITIVE Sensitive     TRIMETH/SULFA <=10 SENSITIVE Sensitive     CLINDAMYCIN <=0.25 SENSITIVE Sensitive     RIFAMPIN <=0.5 SENSITIVE Sensitive     Inducible Clindamycin NEGATIVE Sensitive     * ABUNDANT STAPHYLOCOCCUS AUREUS  SARS Coronavirus 2 by RT PCR (hospital order, performed in [facility] hospital lab) Nasopharyngeal Nasopharyngeal Swab     Status: None   Collection Time: 01/08/19  9:00 AM   Specimen: Nasopharyngeal Swab  Result Value Ref Range Status   SARS Coronavirus 2 NEGATIVE NEGATIVE  Final    Comment: (NOTE) SARS-CoV-2 target nucleic acids are NOT DETECTED. The SARS-CoV-2 RNA is generally detectable in upper and lower respiratory specimens during the acute phase of infection. The lowest concentration of SARS-CoV-2 viral copies this assay can detect is 250 copies / mL. A negative result does not preclude SARS-CoV-2 infection and should not be used as the sole basis for treatment or other patient management decisions.  A negative result may occur with improper specimen collection / handling, submission of specimen other than nasopharyngeal swab, presence of viral mutation(s) within the areas targeted by this assay, and inadequate number of viral copies (<250 copies / mL). A negative result must be combined with clinical observations,  patient history, and epidemiological information. Fact Sheet for Patients:   StrictlyIdeas.no Fact Sheet for Healthcare Providers: BankingDealers.co.za This test is not yet approved or cleared  by the [place] FDA and has been authorized for detection and/or diagnosis of SARS-CoV-2 by FDA under an Emergency Use Authorization (EUA).  This EUA will remain in effect (meaning this test can be used) for the duration of the COVID-19 declaration under Section 564(b)(1) of the Act, 21 U.S.C. section 360bbb-3(b)(1), unless the authorization is terminated or revoked sooner. Performed at [facility], [place] [address] [place], [place] [address]   Blood Culture (routine x 2)     Status: None (Preliminary result)   Collection Time: 01/08/19  9:07 AM   Specimen: BLOOD LEFT HAND  Result Value Ref Range Status   Specimen Description BLOOD LEFT HAND  Final   Special Requests   Final    BOTTLES DRAWN AEROBIC AND ANAEROBIC Blood Culture adequate volume   Culture   Final    NO GROWTH 2 DAYS Performed at [facility], [place] [address] [place], [place] [address]    Report Status PENDING  Incomplete  Urine culture     Status: None   Collection Time: 01/08/19  9:10 AM   Specimen: In/Out Cath Urine  Result Value Ref Range Status   Specimen Description IN/OUT CATH URINE  Final   Special Requests NONE  Final   Culture   Final    NO GROWTH Performed at [facility], [place] [address] [place], [place] [address]    Report Status 01/09/2019 FINAL  Final  Aerobic/Anaerobic Culture (surgical/deep wound)     Status: None (Preliminary result)   Collection Time: 01/08/19  5:21 PM   Specimen: PATH Benign ortho; Tissue  Result Value Ref Range Status   Specimen Description ABSCESS LEFT KNEE  Final   Special Requests PREPATELLAR BURSA  Final   Gram Stain   Final    MODERATE WBC PRESENT,BOTH PMN AND MONONUCLEAR RARE GRAM POSITIVE COCCI Performed  at [facility], [place] [address], [place],  [address]    Culture   Final    FEW STAPHYLOCOCCUS AUREUS NO ANAEROBES ISOLATED; CULTURE IN PROGRESS FOR 5 DAYS    Report Status PENDING  Incomplete   Organism ID, Bacteria STAPHYLOCOCCUS AUREUS  Final      Susceptibility   Staphylococcus aureus - MIC*    CIPROFLOXACIN >=8 RESISTANT Resistant     ERYTHROMYCIN >=8 RESISTANT Resistant     GENTAMICIN <=0.5 SENSITIVE Sensitive     OXACILLIN 0.5 SENSITIVE Sensitive     TETRACYCLINE <=1 SENSITIVE Sensitive     VANCOMYCIN <=0.5 SENSITIVE Sensitive     TRIMETH/SULFA <=10 SENSITIVE Sensitive     CLINDAMYCIN <=0.25 SENSITIVE Sensitive  RIFAMPIN <=0.5 SENSITIVE Sensitive     Inducible Clindamycin NEGATIVE Sensitive     * FEW STAPHYLOCOCCUS AUREUS    Impression/Plan:  1. Septic left prepatellar bursitis - s/p debridement on 11/24.  Culture with MRSA.  No positive blood cultures.  Improved. Would treat for 4 weeks total through December 21 with linezolid - he will need to get this arranged prior to discharge to assure it is available.  I will arrange follow up in about 2 weeks.  Will need to check CBC on linezolid  2.  Medication monitoring - platelets wnl.  Will monitor as an outpatient.    3.  Leukocytosis - remains elevated but not unexpected.  Clinically he is doing well.    I will sign off, call with any questions.

## 2019-01-11 NOTE — Progress Notes (Signed)
Pt's BP this evening were 177/96 at the beginning of the shift & then 172/93 just before 0000.  Pt only has Norvasc 5 mg ordered which was given at 2105.  Paged the on call physician and asked if the pt should get PRN blood pressure meds ordered.  The physician said to continue to monitor the pt's blood pressure and if the systolic is > 809 or pt becomes symptomatic, then physician may order more BP meds.  Will continue to monitor.  Lupita Dawn, RN

## 2019-01-11 NOTE — Progress Notes (Signed)
Pt discharged home with girlfriend. IV and telemetry box removed. Pt received discharge instructions and all questions were answered. Pt left with all of his belongings. [facility] delivered pt's new meds prior to discharge. Pt was discharged via wheelchair and was accompanied by a Wilfrid Lund and pt's girlfriend.

## 2019-01-11 NOTE — Discharge Summary (Signed)
Name: Chris Lynch MRN: [id number] DOB: Dec 13, 1960 58 y.o. PCP: Leonard Downing, MD  Date of Admission: 01/07/2019 11:28 PM Date of Discharge: 01/11/2019 01/11/2019  2:42 PM Attending Physician: No att. providers found  Discharge Diagnosis:  1. Septic arthritis of the L prepatellar bursa 2. Hypertension 3. GERD  Discharge Medications: Allergies as of 01/11/2019      Reactions   Ace Inhibitors Other (See Comments)   Pt said it made him feel sick, lethargic      Medication List    TAKE these medications   amLODipine 5 MG tablet Commonly known as: NORVASC Take 5 mg by mouth at bedtime.   clonazePAM 0.5 MG tablet Commonly known as: KLONOPIN Take 0.5 mg by mouth daily as needed. Restless legs   famotidine 20 MG tablet Commonly known as: PEPCID Take 20 mg by mouth 2 (two) times daily.   linezolid 600 MG tablet Commonly known as: ZYVOX Take 1 tablet (600 mg total) by mouth 2 (two) times daily for 28 days.   oxyCODONE 5 MG immediate release tablet Commonly known as: Oxy IR/ROXICODONE Take 1 tablet (5 mg total) by mouth every 4 (four) hours as needed for moderate pain.   triamterene-hydrochlorothiazide 75-50 MG tablet Commonly known as: MAXZIDE Take 1 tablet by mouth daily.       Disposition and follow-up:   Mr.Chris Lynch was discharged from [facility] in Good condition.  At the hospital follow up visit please address:   1.  Please ensure adherence to 4 week course of Linezolid. Please check blood pressure and determine adequate BP regimen. Please consider outpatient GI referral for GERD. Please continue to counsel on stopping drinking alcohol.  2.  Labs / imaging needed at time of follow-up: na  3.  Pending labs/ test needing follow-up: na  Follow-up Appointments: Follow-up Information    Leonard Downing, MD. Schedule an appointment as soon as possible for a visit in 1 week(s).   Specialty: Family Medicine Contact  information: [place] [address] [phone]        Leandrew Koyanagi, MD. Schedule an appointment as soon as possible for a visit in 2 week(s).   Specialty: Orthopedic Surgery Contact information: [place] [address] [place] by problem list:  1. Septic arthritis of the L prepatellar bursa -pt presented with L knee pain found to have infection from staph aureus status post I&D with orthopedics with negative blood cultures who defervesced with a decreasing white blood count on IV vancomycin while in the hospital who was discharged on 4 weeks of oral linezolid  2. Hypertension -Patient with known hypertension presented with blood pressures in the 180s over 100s with some improvement with initiation of his home medicine hydrochlorothiazide triamterene; given blood pressure continue to be elevated we started amlodipine 5 mg; patient asymptomatic and amenable to blood pressure medication follow-up with his primary care doctor  3. GERD -Patient with complaints of very bad acid reflux until he started his home twice daily Pepcid; he appears to have a long history of GERD most severely in the last 7 to 8 months when his drinking has also increased heavily; we started him on a once a day PPI and recommended that he have outpatient follow-up with a GI doctor for possible upper endoscopy; we also discussed that heavy alcohol use is the likely cause of his problems and that  we recommend he stop drinking altogether  Discharge Vitals:   BP (!) 149/89 (BP Location: Right Arm)   Pulse 87   Temp 97.8 F (36.6 C) (Oral)   Resp 15   Ht 5\' 7"  (1.702 m)   Wt 97.5 kg   SpO2 100%   BMI 33.67 kg/m   Pertinent Labs, Studies, and Procedures:  CBC Latest Ref Rng & Units 01/11/2019 01/10/2019 01/09/2019  WBC 4.0 - 10.5 K/uL 16.2(H) 24.4(H) 19.7(H)  Hemoglobin 13.0 - 17.0 g/dL 01/11/2019 11.7(L) 14.5  Hematocrit 39.0 - 52.0 % 39.8  34.8(L) 42.9  Platelets 150 - 400 K/uL 364 293 191   L Knee Fluid Culture Specimen Description KNEE   Special Requests NONE   Gram Stain ABUNDANT WBC PRESENT,BOTH PMN AND MONONUCLEAR  RARE GRAM POSITIVE COCCI  Performed at [facility], [address], [place], [place]   Culture ABUNDANT STAPHYLOCOCCUS AUREUS   Report Status 01/10/2019 FINAL   Organism ID, Bacteria STAPHYLOCOCCUS AUREUS     Blood Culture Specimen Description BLOOD LEFT HAND   Special Requests BOTTLES DRAWN AEROBIC AND ANAEROBIC Blood Culture adequate volume   Culture NO GROWTH 5 DAYS  Performed at [facility], [address] [place], [place]   Report Status 01/13/2019 FINAL    L Knee X-Ray IMPRESSION: Soft tissue swelling compatible with cellulitis. No underlying bony or joint abnormality. No foreign body or soft tissue gas.  L Tib/Fib X-Ray IMPRESSION: Subcutaneous edema compatible with cellulitis. Negative for soft tissue gas or foreign body.  L Femur X-Ray IMPRESSION: Infiltration of subcutaneous fatty tissues compatible with cellulitis. Negative for soft tissue gas or foreign body.  Orthopedic Surgery PROCEDURE: Irrigation and debridement of left prepatellar septic bursitis  PREOPERATIVE DIAGNOSIS: Septic left prepatellar bursitis  POSTOPERATIVE DIAGNOSIS: Same.   Discharge Instructions: Discharge Instructions    Diet - low sodium heart healthy   Complete by: As directed    Increase activity slowly   Complete by: As directed       Signed: 01/15/2019, MD 01/14/2019, 7:15 AM   Pager: [phone]

## 2019-01-11 NOTE — TOC Transition Note (Signed)
Transition of Care [facility]) - CM/SW Discharge Note Marvetta Gibbons RN, BSN Transitions of Care Unit 4E- RN Case Manager [phone]   Patient Details  Name: Chris Lynch MRN: [id number] Date of Birth: Jun 16, 1960  Transition of [facility]) CM/SW Contact:  Dawayne Patricia, RN Phone Number: 01/11/2019, 11:21 AM   Clinical Narrative:    Pt stable for transition home today, PCP- Arelia Sneddon, pt to be assisted with MATCH for po abx needs- TOC pharmacy to fill meds prior to discharge and deliver to bedside.    Final next level of care: Home/Self Care Barriers to Discharge: No Barriers Identified           Discharge Placement               Home/ self care        Discharge Plan and Services   Discharge Planning Services: CM Consult                                 Social Determinants of Health (SDOH) Interventions     Readmission Risk Interventions No flowsheet data found.

## 2019-01-11 NOTE — Progress Notes (Signed)
   Subjective:  Patient reports pain as much better since surgery.  Lateral thigh is less tender as well.  Objective:   VITALS:   Vitals:   01/10/19 1930 01/10/19 2349 01/11/19 0509 01/11/19 1033  BP: (!) 177/96 (!) 172/93 (!) 162/99 (!) 157/89  Pulse: 86 79 88 82  Resp:  18 16   Temp: 97.6 F (36.4 C) 98.1 F (36.7 C) 97.9 F (36.6 C)   TempSrc: Oral Oral Oral   SpO2: 100% 99% 100%   Weight:      Height:        LLE blisters stable Surgical incision is c/d/i Cellulitis is improved and thigh is less tender Knee ROM is significantly improved   Lab Results  Component Value Date   WBC 24.4 (H) 01/10/2019   HGB 11.7 (L) 01/10/2019   HCT 34.8 (L) 01/10/2019   MCV 89.2 01/10/2019   PLT 293 01/10/2019     Assessment/Plan:  3 Days Post-Op   - will receive 4 weeks of linezolid - wound care discussed with patient and RN at bedside today, orders placed - f/u 2 weeks in office  Eduard Roux 01/11/2019, 10:50 AM [phone]

## 2019-01-13 LAB — CULTURE, BLOOD (ROUTINE X 2)
Culture: NO GROWTH
Culture: NO GROWTH
Special Requests: ADEQUATE
Special Requests: ADEQUATE

## 2019-01-13 LAB — AEROBIC/ANAEROBIC CULTURE W GRAM STAIN (SURGICAL/DEEP WOUND)

## 2019-01-14 ENCOUNTER — Telehealth: Payer: Self-pay | Admitting: Orthopaedic Surgery

## 2019-01-14 NOTE — Telephone Encounter (Signed)
Patient is out of the yellow Xeroform peprolatum dressing (559 size)and has no job nor income. Wants to know if you can give him some to hold him over.  Please call patient at [phone]

## 2019-01-14 NOTE — Telephone Encounter (Signed)
Yes that's fine 

## 2019-01-14 NOTE — Telephone Encounter (Signed)
S/P 01/08/2019 IRRIGATION AND DEBRIDEMENT OF PREPATELLAR LEFT KNEE  Does he need some?

## 2019-01-15 NOTE — Telephone Encounter (Signed)
Patient picked up yesterday.

## 2019-01-22 ENCOUNTER — Inpatient Hospital Stay: Payer: Self-pay | Admitting: Orthopaedic Surgery

## 2019-01-28 ENCOUNTER — Telehealth: Payer: Self-pay | Admitting: Orthopaedic Surgery

## 2019-01-28 ENCOUNTER — Encounter: Payer: Self-pay | Admitting: Internal Medicine

## 2019-01-28 ENCOUNTER — Ambulatory Visit (INDEPENDENT_AMBULATORY_CARE_PROVIDER_SITE_OTHER): Payer: Self-pay | Admitting: Internal Medicine

## 2019-01-28 ENCOUNTER — Other Ambulatory Visit: Payer: Self-pay

## 2019-01-28 VITALS — BP 166/94 | HR 97 | Wt 210.0 lb

## 2019-01-28 DIAGNOSIS — Z5181 Encounter for therapeutic drug level monitoring: Secondary | ICD-10-CM

## 2019-01-28 DIAGNOSIS — M71162 Other infective bursitis, left knee: Secondary | ICD-10-CM

## 2019-01-28 DIAGNOSIS — A4901 Methicillin susceptible Staphylococcus aureus infection, unspecified site: Secondary | ICD-10-CM

## 2019-01-28 HISTORY — DX: Encounter for therapeutic drug level monitoring: Z51.81

## 2019-01-28 NOTE — Assessment & Plan Note (Signed)
On linezolid, I will check a cbc to assure no leukopenia or thrombocytopenia.

## 2019-01-28 NOTE — Assessment & Plan Note (Signed)
Resolving at this time on linezolid.  He will continue this another week and can stop.

## 2019-01-28 NOTE — Progress Notes (Signed)
   Subjective:    Patient ID: Chris Lynch, male    DOB: 23-Jan-1961, 58 y.o.   MRN: [id number]  HPI Here for hsfu He had septic left prepatellar bursitis last month and underwent surgical debridement by Dr. Erlinda Hong.  Culture with MRSA and was on vancomycin and now linezolid at discharge through 12/21.  No new issues, tolerating well and no associated n/v/d.  Knee doing well and no drainage.  Open area superiorly and no issues.  He was told by Dr. Erlinda Hong that it was open to help with drainage.    He has not been back to Dr. Erlinda Hong since he was told he would need to pay $100 which he does not have.  Stitches remain in and was told by someone that I may take out his stitches.  No fever or chills.    Review of Systems  Constitutional: Negative for chills and fever.  Cardiovascular: Negative for leg swelling.  Gastrointestinal: Negative for diarrhea.       Objective:   Physical Exam Constitutional:      Appearance: Normal appearance.  Eyes:     General: No scleral icterus. Pulmonary:     Effort: Pulmonary effort is normal.  Musculoskeletal:     Comments: Leg with stitiches in, closed incision.  Superior area remains open with no significant drainage and no surrounding erythema.    Neurological:     General: No focal deficit present.     Mental Status: He is alert.  Psychiatric:        Mood and Affect: Mood normal.   SH; history of smoking        Assessment & Plan:

## 2019-01-28 NOTE — Telephone Encounter (Signed)
There shouldn't be a charge at all because he had surgery and he's still in global period.

## 2019-01-28 NOTE — Telephone Encounter (Signed)
FYI - Patient called back to r/s his post op to get his sutures removed and was told by the person making the appointment that he would have to pay $100 to have them removed.  I advised the patient that the $100.00 deposit is only for the first initial NEW PT appointment and that after that appointment he can make a payment towards his account, but is not required to.  Patient is scheduled for Thursday, December 17th.

## 2019-01-28 NOTE — Telephone Encounter (Signed)
Patient called to requests an RX refill for his Oxycodone.  Patient uses [facility] on [address]  [phone].  Thank you.

## 2019-01-28 NOTE — Assessment & Plan Note (Signed)
His wound is doing well and does not appear to be infected at this time, healing well.  Will help get him back to his surgeon for further management including suture removal.  This has been discussed with Dr. Phoebe Sharps office who will gladly see him.

## 2019-01-29 ENCOUNTER — Other Ambulatory Visit: Payer: Self-pay | Admitting: Physician Assistant

## 2019-01-29 LAB — CBC WITH DIFFERENTIAL/PLATELET
Absolute Monocytes: 675 cells/uL (ref 200–950)
Basophils Absolute: 59 cells/uL (ref 0–200)
Basophils Relative: 1.1 %
Eosinophils Absolute: 389 cells/uL (ref 15–500)
Eosinophils Relative: 7.2 %
HCT: 39.6 % (ref 38.5–50.0)
Hemoglobin: 13.7 g/dL (ref 13.2–17.1)
Lymphs Abs: 1987 cells/uL (ref 850–3900)
MCH: 29.8 pg (ref 27.0–33.0)
MCHC: 34.6 g/dL (ref 32.0–36.0)
MCV: 86.1 fL (ref 80.0–100.0)
MPV: 9.8 fL (ref 7.5–12.5)
Monocytes Relative: 12.5 %
Neutro Abs: 2290 cells/uL (ref 1500–7800)
Neutrophils Relative %: 42.4 %
Platelets: 226 10*3/uL (ref 140–400)
RBC: 4.6 10*6/uL (ref 4.20–5.80)
RDW: 12.8 % (ref 11.0–15.0)
Total Lymphocyte: 36.8 %
WBC: 5.4 10*3/uL (ref 3.8–10.8)

## 2019-01-29 LAB — BASIC METABOLIC PANEL
BUN/Creatinine Ratio: 21 (calc) (ref 6–22)
BUN: 26 mg/dL — ABNORMAL HIGH (ref 7–25)
CO2: 28 mmol/L (ref 20–32)
Calcium: 8.8 mg/dL (ref 8.6–10.3)
Chloride: 101 mmol/L (ref 98–110)
Creat: 1.25 mg/dL (ref 0.70–1.33)
Glucose, Bld: 133 mg/dL — ABNORMAL HIGH (ref 65–99)
Potassium: 3.7 mmol/L (ref 3.5–5.3)
Sodium: 139 mmol/L (ref 135–146)

## 2019-01-29 MED ORDER — HYDROCODONE-ACETAMINOPHEN 5-325 MG PO TABS: 1.0000 | ORAL_TABLET | Freq: Every day | ORAL | 0 refills | Status: DC | PRN

## 2019-01-29 NOTE — Telephone Encounter (Signed)
Weaning to norco and I just sent in

## 2019-01-31 ENCOUNTER — Encounter: Payer: Self-pay | Admitting: Orthopaedic Surgery

## 2019-01-31 ENCOUNTER — Ambulatory Visit (INDEPENDENT_AMBULATORY_CARE_PROVIDER_SITE_OTHER): Payer: Self-pay | Admitting: Orthopaedic Surgery

## 2019-01-31 ENCOUNTER — Other Ambulatory Visit: Payer: Self-pay

## 2019-01-31 DIAGNOSIS — M71162 Other infective bursitis, left knee: Secondary | ICD-10-CM

## 2019-01-31 NOTE — Progress Notes (Signed)
Patient ID: Chris Lynch, male   DOB: Aug 24, 1960, 58 y.o.   MRN: [id number]  [name] is 3 weeks status post septic prepatellar bursitis I&D.  He comes in for his first appointment today.  He is doing well overall.  No real complaints.  Surgical incision is fully healed.  No signs of continued action.  His thigh is much less tender and there is no signs of cellulitis.  He has good range of motion of his knee without pain.  We remove the sutures today and placed Bactroban ointment on the small area that was originally left open.  He will do this twice a day.  He has no activity restrictions.  He has completed his course of antibiotics.  We can see him back as needed.

## 2019-02-12 ENCOUNTER — Telehealth: Payer: Self-pay | Admitting: Orthopaedic Surgery

## 2019-02-12 ENCOUNTER — Other Ambulatory Visit: Payer: Self-pay | Admitting: Physician Assistant

## 2019-02-12 NOTE — Telephone Encounter (Signed)
Patient called stating that he has been having a reaction to the antibiotic he was prescribed.  He stated that he has had severe stomach pains, sob, and just not feeling well all over along with being shaky.  CB#[phone].  Thank you.

## 2019-02-12 NOTE — Telephone Encounter (Signed)
Looks like he had finished his abx prior to his visit with xu on 12/17.  Could it be something else?  Best to take a benadryl and go to the ED if short of breath

## 2019-02-12 NOTE — Telephone Encounter (Signed)
See message.

## 2019-02-12 NOTE — Telephone Encounter (Signed)
Called patient States he has 6 pills left. Linezolid.  Advised message below to patient.

## 2019-02-13 ENCOUNTER — Other Ambulatory Visit: Payer: Self-pay | Admitting: Physician Assistant

## 2019-02-13 NOTE — Telephone Encounter (Signed)
Called patient no answer LMOM to return our call to advise on message below. 

## 2019-02-13 NOTE — Telephone Encounter (Signed)
Make sure he has stopped this abx

## 2019-02-13 NOTE — Telephone Encounter (Signed)
Called patient no answer.

## 2019-07-02 ENCOUNTER — Ambulatory Visit
Admit: 2019-07-02 | Discharge: 2019-07-02 | Payer: Worker's Compensation | Attending: Orthopaedic Surgery | Primary: Student in an Organized Health Care Education/Training Program

## 2019-07-02 ENCOUNTER — Ambulatory Visit: Attending: Orthopaedic Surgery

## 2019-07-02 DIAGNOSIS — G5622 Lesion of ulnar nerve, left upper limb: Secondary | ICD-10-CM

## 2019-07-02 NOTE — Progress Notes (Signed)
New Patient Visit    Date:  07/02/19    Name: Daryl Williams  MRN: [id number]      CC:   Chief Complaint   Patient presents with   ??? Elbow Pain     lt elbow, 2nd op          HPI:    Daryl Williams is a 59 y.o. male who presents with Left medial elbow pain.  He was initially injured nerve in bur of 2019. 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  Continue to work through the pain.  Ultimately he was workup and had a left rotator cuff tear repaired arthroscopically in March of 2020. He states he was unable to complete the postop therapy regimen secondary to pain in the elbow.  He is currently on a permanent 15 lb overhead weight limit for the shoulder.  He was seen and assessed for his medial elbow pain.  He underwent a corticosteroid injection without help.  He was having ulnar nerve symptoms in addition to his medial elbow pain.  Ultimately he underwent a left medial epicondyle debridement and left ulnar nerve in situ decompression in August of 2020. Postoperatively he did some therapy but had to quit due to persistent medial-sided elbow pain.  And EMG was ordered in October of 2020 and positive for changes to the sensory component of the ulnar nerve.  Ultrasound was performed in February 2021 which revealed subluxation of the ulnar nerve with flexion and extension of the elbow.  He has been recommended to him that he undergo an ulnar nerve transposition into his submuscular position.  He now presents for evaluation treatment.  He has less numbness and tingling in the 4th and 5th digits in more pain along the course of the ulnar nerve and pain with elbow use due to the a painful snapping on the medial-sided elbow pain      PMH:   Past Medical History:   Diagnosis Date   ??? Hypertension        PSH:   Past Surgical History:   Procedure Laterality Date   ??? PR ANESTH,SURGERY OF SHOULDER     ??? UPPER  ARM/ELBOW SURGERY UNLISTED         Social History:   Social History     Socioeconomic History   ??? Marital status: MARRIED     Spouse name: Not on file   ??? Number of children: Not on file   ??? Years of education: Not on file   ??? Highest education level: Not on file   Occupational History   ??? Not on file   Social Needs   ??? Financial resource strain: Not on file   ??? Food insecurity     Worry: Not on file     Inability: Not on file   ??? Transportation needs     Medical: Not on file     Non-medical: Not on file   Tobacco Use   ??? Smoking status: Never Smoker   ??? Smokeless tobacco: Never Used   Substance and Sexual Activity   ??? Alcohol use: Yes     Frequency: Never   ??? Drug use: Never   ??? Sexual activity: Not on file   Lifestyle   ??? Physical activity     Days per week: Not on file     Minutes per session: Not on file   ???  Stress: Not on file   Relationships   ??? Social connections     Talks on phone: Not on file     Gets together: Not on file     Attends religious service: Not on file     Active member of club or organization: Not on file     Attends meetings of clubs or organizations: Not on file     Relationship status: Not on file   ??? Intimate partner violence     Fear of current or ex partner: Not on file     Emotionally abused: Not on file     Physically abused: Not on file     Forced sexual activity: Not on file   Other Topics Concern   ??? Not on file   Social History Narrative   ??? Not on file       Allergies: Not on File    Medications:   Prior to Admission medications    Medication Sig Start Date End Date Taking? Authorizing Provider   atorvastatin (LIPITOR) 40 mg tablet Take 40 mg by mouth daily.   Yes Provider, Historical   tamsulosin HCl (TAMSULOSIN PO) Take  by mouth.   Yes Provider, Historical   naproxen (Naprosyn) 500 mg tablet Take 500 mg by mouth two (2) times daily (with meals).   Yes Provider, Historical   omeprazole (PRILOSEC) 40 mg capsule Take 40 mg by mouth daily.   Yes Provider, Historical    hydroCHLOROthiazide (HYDRODIURIL) 12.5 mg tablet Take 12.5 mg by mouth daily.   Yes Provider, Historical   nicotine (Nicoderm CQ) 21 mg/24 hr 1 Patch by TransDERmal route every twenty-four (24) hours.   Yes Provider, Historical       Problem List: There is no problem list on file for this patient.        ROS  General: no fevers, chills, night sweats  Psychiatric: no suicidal ideation, anxiety  Neurologic: no headaches, loss of consciousness or change in vision  Cardiovascular: no chest pain  Respiratory: no shortness of breath  GI: no abdominal pain, no ulcers  Skin: no infections, no rashes  Hematologic: no bleeding disorders, history of blood clots  Endocrine: No diabetes mellitus  Musckuloskeletal:   Chief Complaint   Patient presents with   ??? Elbow Pain     lt elbow, 2nd op          PHYSICAL EXAM:  There were no vitals taken for this visit.  @BW@        There is no height or weight on file to calculate BMI.    General:  The patient is a well-appearing 58 y.o. male who appears his stated age. he is alert and oriented x3, pleasant, conversive, interactive and appropriate.    Skin  Intact, no abrasions or open wounds.  There is a well-healed medial incision.    Inspection  There is medial-sided elbow swelling.    Palpation  He is tender along the course of the ulnar nerve.  He he is nontender the biceps, triceps, medial epicondyle, lateral epicondyle, or radial tunnel.    Vascular  The hand is warm and well-perfused. 2+ radial and ulnar pulses. Capillary refill is less than two seconds.     Sensory  Little finger 2 point discrimination is 6 mm.     Motor  First dorsal interosseous and FDP the 5th digit are 5/5 without atrophy.    Specialized musculoskeletal examination:  He has a positive Tinel at the   elbow but a negative elbow flexion test.  His ulnar nerve subluxates at the elbow with flexion extension.  He has full elbow flexion and full elbow extension.  He has full forearm pronation and full forearm  supination.  No pain with resisted wrist extension, resisted forearm pronation, or resisted forearm supination.  His lateral collateral and medial collateral ligaments are stable stress testing.  ______________________________________________________________________  Nerve conduction study:   EMG reveals changes in the ulnar nerve with loss of the dorsal ulnar sensory nerve response and no response on the sensory side of the nerve will of the elbow.  ______________________________________________________________________    IMPRESSION:  Encounter Diagnoses     ICD-10-CM ICD-9-CM   1. Irritation of left ulnar nerve  G56.22 354.2        PLAN:  Today we discussed the diagnosis and treatment options.  His medial epicondylitis on the left seems to have resolved with the surgical intervention performed.  Unfortunately, he is having symptomatic subluxation of the left ulnar nerve after the in situ decompression.  This is causing a fair amount of pain in the medial aspect of the elbow and limiting his function.  Therefore, I feel it is reasonable for him to undergo a left ulnar nerve transposition.  Given the paucity of fat beneath his skin, we discussed that a submuscular ulnar nerve transposition would likely be better as it will pad the nerve from being injured just beneath the skin.  We discussed a 6-9 month recovery.  He return to his prior surgeon for further evaluation treatment.      FOLLOW-UP: prn    IMAGING NEXT VISIT: no

## 2019-07-02 NOTE — Progress Notes (Signed)
New Patient Visit    Date:  07/02/19    Name: Daryl Williams  MRN: [id number]      CC:   Chief Complaint   Patient presents with   ??? Elbow Pain     lt elbow, 2nd op          HPI:    Daryl Williams is a 59 y.o. male who presents with Left medial elbow pain.  He was initially injured nerve in bur of 2019. 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  Continue to work through the pain.  Ultimately he was workup and had a left rotator cuff tear repaired arthroscopically in March of 2020. He states he was unable to complete the postop therapy regimen secondary to pain in the elbow.  He is currently on a permanent 15 lb overhead weight limit for the shoulder.  He was seen and assessed for his medial elbow pain.  He underwent a corticosteroid injection without help.  He was having ulnar nerve symptoms in addition to his medial elbow pain.  Ultimately he underwent a left medial epicondyle debridement and left ulnar nerve in situ decompression in August of 2020. Postoperatively he did some therapy but had to quit due to persistent medial-sided elbow pain.  And EMG was ordered in October of 2020 and positive for changes to the sensory component of the ulnar nerve.  Ultrasound was performed in February 2021 which revealed subluxation of the ulnar nerve with flexion and extension of the elbow.  He has been recommended to him that he undergo an ulnar nerve transposition into his submuscular position.  He now presents for evaluation treatment.  He has less numbness and tingling in the 4th and 5th digits in more pain along the course of the ulnar nerve and pain with elbow use due to the a painful snapping on the medial-sided elbow pain      PMH:   Past Medical History:   Diagnosis Date   ??? Hypertension        PSH:   Past Surgical History:   Procedure Laterality Date   ??? PR ANESTH,SURGERY OF SHOULDER     ??? UPPER  ARM/ELBOW SURGERY UNLISTED         Social History:   Social History     Socioeconomic History   ??? Marital status: MARRIED     Spouse name: Not on file   ??? Number of children: Not on file   ??? Years of education: Not on file   ??? Highest education level: Not on file   Occupational History   ??? Not on file   Social Needs   ??? Financial resource strain: Not on file   ??? Food insecurity     Worry: Not on file     Inability: Not on file   ??? Transportation needs     Medical: Not on file     Non-medical: Not on file   Tobacco Use   ??? Smoking status: Never Smoker   ??? Smokeless tobacco: Never Used   Substance and Sexual Activity   ??? Alcohol use: Yes     Frequency: Never   ??? Drug use: Never   ??? Sexual activity: Not on file   Lifestyle   ??? Physical activity     Days per week: Not on file     Minutes per session: Not on file   ???  Stress: Not on file   Relationships   ??? Social Wellsite geologist on phone: Not on file     Gets together: Not on file     Attends religious service: Not on file     Active member of club or organization: Not on file     Attends meetings of clubs or organizations: Not on file     Relationship status: Not on file   ??? Intimate partner violence     Fear of current or ex partner: Not on file     Emotionally abused: Not on file     Physically abused: Not on file     Forced sexual activity: Not on file   Other Topics Concern   ??? Not on file   Social History Narrative   ??? Not on file       Allergies: Not on File    Medications:   Prior to Admission medications    Medication Sig Start Date End Date Taking? Authorizing Provider   atorvastatin (LIPITOR) 40 mg tablet Take 40 mg by mouth daily.   Yes Provider, Historical   tamsulosin HCl (TAMSULOSIN PO) Take  by mouth.   Yes Provider, Historical   naproxen (Naprosyn) 500 mg tablet Take 500 mg by mouth two (2) times daily (with meals).   Yes Provider, Historical   omeprazole (PRILOSEC) 40 mg capsule Take 40 mg by mouth daily.   Yes Provider, Historical    hydroCHLOROthiazide (HYDRODIURIL) 12.5 mg tablet Take 12.5 mg by mouth daily.   Yes Provider, Historical   nicotine (Nicoderm CQ) 21 mg/24 hr 1 Patch by TransDERmal route every twenty-four (24) hours.   Yes Provider, Historical       Problem List: There is no problem list on file for this patient.        ROS  General: no fevers, chills, night sweats  Psychiatric: no suicidal ideation, anxiety  Neurologic: no headaches, loss of consciousness or change in vision  Cardiovascular: no chest pain  Respiratory: no shortness of breath  GI: no abdominal pain, no ulcers  Skin: no infections, no rashes  Hematologic: no bleeding disorders, history of blood clots  Endocrine: No diabetes mellitus  Musckuloskeletal:   Chief Complaint   Patient presents with   ??? Elbow Pain     lt elbow, 2nd op          PHYSICAL EXAM:  There were no vitals taken for this visit.  @BW @        There is no height or weight on file to calculate BMI.    General:  The patient is a well-appearing 59 y.o. male who appears his stated age. he is alert and oriented x3, pleasant, conversive, interactive and appropriate.    Skin  Intact, no abrasions or open wounds.  There is a well-healed medial incision.    Inspection  There is medial-sided elbow swelling.    Palpation  He is tender along the course of the ulnar nerve.  He he is nontender the biceps, triceps, medial epicondyle, lateral epicondyle, or radial tunnel.    Vascular  The hand is warm and well-perfused. 2+ radial and ulnar pulses. Capillary refill is less than two seconds.     Sensory  Little finger 2 point discrimination is 6 mm.     Motor  First dorsal interosseous and FDP the 5th digit are 5/5 without atrophy.    Specialized musculoskeletal examination:  He has a positive Tinel at the  elbow but a negative elbow flexion test.  His ulnar nerve subluxates at the elbow with flexion extension.  He has full elbow flexion and full elbow extension.  He has full forearm pronation and full forearm  supination.  No pain with resisted wrist extension, resisted forearm pronation, or resisted forearm supination.  His lateral collateral and medial collateral ligaments are stable stress testing.  ______________________________________________________________________  Nerve conduction study:   EMG reveals changes in the ulnar nerve with loss of the dorsal ulnar sensory nerve response and no response on the sensory side of the nerve will of the elbow.  ______________________________________________________________________    IMPRESSION:  Encounter Diagnoses     ICD-10-CM ICD-9-CM   1. Irritation of left ulnar nerve  G56.22 354.2        PLAN:  Today we discussed the diagnosis and treatment options.  His medial epicondylitis on the left seems to have resolved with the surgical intervention performed.  Unfortunately, he is having symptomatic subluxation of the left ulnar nerve after the in situ decompression.  This is causing a fair amount of pain in the medial aspect of the elbow and limiting his function.  Therefore, I feel it is reasonable for him to undergo a left ulnar nerve transposition.  Given the paucity of fat beneath his skin, we discussed that a submuscular ulnar nerve transposition would likely be better as it will pad the nerve from being injured just beneath the skin.  We discussed a 6-9 month recovery.  He return to his prior surgeon for further evaluation treatment.      FOLLOW-UP: prn    IMAGING NEXT VISIT: no

## 2019-07-26 ENCOUNTER — Other Ambulatory Visit: Payer: Self-pay

## 2019-07-26 ENCOUNTER — Emergency Department (HOSPITAL_COMMUNITY)
Admission: EM | Admit: 2019-07-26 | Discharge: 2019-07-26 | Disposition: A | Discharge status: Home / Self Care | Payer: Self-pay | Attending: Emergency Medicine | Admitting: Emergency Medicine

## 2019-07-26 ENCOUNTER — Emergency Department (HOSPITAL_COMMUNITY): Payer: Self-pay

## 2019-07-26 ENCOUNTER — Encounter (HOSPITAL_COMMUNITY): Payer: Self-pay | Admitting: Emergency Medicine

## 2019-07-26 DIAGNOSIS — I10 Essential (primary) hypertension: Secondary | ICD-10-CM | POA: Insufficient documentation

## 2019-07-26 DIAGNOSIS — R1010 Upper abdominal pain, unspecified: Secondary | ICD-10-CM | POA: Insufficient documentation

## 2019-07-26 DIAGNOSIS — Z79899 Other long term (current) drug therapy: Secondary | ICD-10-CM | POA: Insufficient documentation

## 2019-07-26 DIAGNOSIS — Z87891 Personal history of nicotine dependence: Secondary | ICD-10-CM | POA: Insufficient documentation

## 2019-07-26 DIAGNOSIS — N5089 Other specified disorders of the male genital organs: Secondary | ICD-10-CM | POA: Insufficient documentation

## 2019-07-26 LAB — COMPREHENSIVE METABOLIC PANEL
ALT: 24 U/L (ref 0–44)
AST: 23 U/L (ref 15–41)
Albumin: 3.8 g/dL (ref 3.5–5.0)
Alkaline Phosphatase: 54 U/L (ref 38–126)
Anion gap: 8 (ref 5–15)
BUN: 24 mg/dL — ABNORMAL HIGH (ref 6–20)
CO2: 24 mmol/L (ref 22–32)
Calcium: 9 mg/dL (ref 8.9–10.3)
Chloride: 105 mmol/L (ref 98–111)
Creatinine, Ser: 1.27 mg/dL — ABNORMAL HIGH (ref 0.61–1.24)
GFR calc Af Amer: >60 mL/min (ref >60)
GFR calc non Af Amer: >60 mL/min (ref >60)
Glucose, Bld: 132 mg/dL — ABNORMAL HIGH (ref 70–99)
Potassium: 3.4 mmol/L — ABNORMAL LOW (ref 3.5–5.1)
Sodium: 137 mmol/L (ref 135–145)
Total Bilirubin: 0.7 mg/dL (ref 0.3–1.2)
Total Protein: 6.9 g/dL (ref 6.5–8.1)

## 2019-07-26 LAB — URINALYSIS, ROUTINE W REFLEX MICROSCOPIC
Bilirubin Urine: NEGATIVE
Glucose, UA: NEGATIVE mg/dL
Hgb urine dipstick: NEGATIVE
Ketones, ur: NEGATIVE mg/dL
Leukocytes,Ua: NEGATIVE
Nitrite: NEGATIVE
Protein, ur: NEGATIVE mg/dL
Specific Gravity, Urine: 1.014 (ref 1.005–1.030)
pH: 6 (ref 5.0–8.0)

## 2019-07-26 LAB — CBC
HCT: 42.8 % (ref 39.0–52.0)
Hemoglobin: 14.3 g/dL (ref 13.0–17.0)
MCH: 29.4 pg (ref 26.0–34.0)
MCHC: 33.4 g/dL (ref 30.0–36.0)
MCV: 87.9 fL (ref 80.0–100.0)
Platelets: 287 10*3/uL (ref 150–400)
RBC: 4.87 MIL/uL (ref 4.22–5.81)
RDW: 13.1 % (ref 11.5–15.5)
WBC: 8 10*3/uL (ref 4.0–10.5)
nRBC: 0 % (ref 0.0–0.2)

## 2019-07-26 LAB — LIPASE, BLOOD: Lipase: 24 U/L (ref 11–51)

## 2019-07-26 MED ORDER — CLARITHROMYCIN 500 MG PO TABS: 500.0000 mg | ORAL_TABLET | Freq: Two times a day (BID) | ORAL | 0 refills | Status: DC

## 2019-07-26 MED ORDER — AMOXICILLIN 500 MG PO CAPS: 1000.0000 mg | ORAL_CAPSULE | Freq: Two times a day (BID) | ORAL | 0 refills | Status: DC

## 2019-07-26 MED ORDER — SODIUM CHLORIDE 0.9% FLUSH: 3.0000 mL | Freq: Once | INTRAVENOUS | Status: DC

## 2019-07-26 MED ORDER — PANTOPRAZOLE SODIUM 20 MG PO TBEC: 20.0000 mg | DELAYED_RELEASE_TABLET | Freq: Two times a day (BID) | ORAL | 0 refills | Status: DC

## 2019-07-26 NOTE — Discharge Instructions (Signed)
Take prescriptions until finished. Try to limit the use of NSAIDs like ibuprofen, aleve, naprosyn, etc. They can irritate your stomach.   Your gallbladder looks fine. We did not identify anything concerning on your ultrasound.  I'm not convinced your testicular swelling is related to an STD particularly since your were recently tested and treated. You could potentially have a varicocele or hydrocele. These are generally note serious conditions but they can cause swelling and/or discomfort. With how long you have had this swelling, I think you should follow-up with a urologist for further evaluation.

## 2019-07-26 NOTE — ED Triage Notes (Signed)
Patient arrives to ED from home with complaints of generalized abdominal pain for the past year. Patient states that it turns into epigastric burning pain after he eats. Patient also reports that his testicles have been swollen for the past two months.

## 2019-07-26 NOTE — ED Provider Notes (Signed)
[facility] EMERGENCY DEPARTMENT Provider Note   CSN: [id number] Arrival date & time: 07/26/19  9242     History Chief Complaint  Patient presents with  . Abdominal Pain  . Groin Swelling    Chris Lynch is a 59 y.o. male.  HPI   59 year old male with abdominal pain.  Abdominal pain has waxed and waned in the past but has been more constant in the past several weeks.  The pain is in the upper abdomen.  Consistently worse after eating.  His PCP recently put him on famotidine which has helped somewhat.  No fevers or chills.  No vomiting or diarrhea.  Additionally, he is complaining of testicular swelling.  This is been going for the past 2 months.  He reports that he was evaluated at a clinic recently and it sounds like he was empirically treated for STDs.   He has not been sexually active since the time of treatment. These symptoms did not improve though. No urinary complaints. No pain.   Past Medical History:  Diagnosis Date  . 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 Hypertension     Patient Active Problem List   Diagnosis Date Noted  . Medication monitoring encounter 01/28/2019  . Essential hypertension 01/09/2019  . Staph aureus infection 01/09/2019  . Septic prepatellar bursitis of left knee   . AKI (acute kidney injury) (HCC)   . Hypokalemia     Past Surgical History:  Procedure Laterality Date  . IRRIGATION AND DEBRIDEMENT KNEE Left 01/08/2019   Procedure: IRRIGATION AND DEBRIDEMENT OF PREPATELLAR LEFT KNEE;  Surgeon: Tarry Kos, MD;  Location: [facility] OR;  Service: Orthopedics;  Laterality: Left;       Family History  Problem Relation Age of Onset  . Heart attack Father     Social History   Tobacco Use  . Smoking status: Former Games developer  . Smokeless tobacco: Never Used  Substance Use Topics  . Alcohol use: Yes    Comment: 2 pints a week of liquor  . 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 Authorizing Provider  amLODipine (NORVASC) 5 MG tablet Take 5 mg by mouth at bedtime.    [provider]  clonazePAM (KLONOPIN) 0.5 MG tablet Take 0.5 mg by mouth daily as needed. Restless legs 12/14/18   [provider]  famotidine (PEPCID) 20 MG tablet Take 20 mg by mouth 2 (two) times daily.    [provider]  HYDROcodone-acetaminophen (NORCO) 5-325 MG tablet Take 1-2 tablets by mouth daily as needed for moderate pain. 01/29/19   Cristie Hem, PA-C  oxyCODONE (OXY IR/ROXICODONE) 5 MG immediate release tablet Take 1 tablet (5 mg total) by mouth every 4 (four) hours as needed for moderate pain. 01/11/19   Jenell Milliner, MD  triamterene-hydrochlorothiazide (MAXZIDE) 75-50 MG tablet Take 1 tablet by mouth daily.    [provider]    Allergies    Ace inhibitors  Review of Systems   Review of Systems All systems reviewed and negative, other than as noted in HPI.  Physical Exam Updated Vital Signs BP (!) 183/91   Pulse 80   Temp 98.7 F (37.1 C) (Oral)   Resp 16   Ht 5\' 7"  (1.702 m)   Wt 99.8 kg   SpO2 98%   BMI  34.46 kg/m   Physical Exam Vitals and nursing note reviewed.  Constitutional:      General: He is not in acute distress.    Appearance: He is well-developed.  HENT:     Head: Normocephalic and atraumatic.  Eyes:     General:        Right eye: No discharge.        Left eye: No discharge.     Conjunctiva/sclera: Conjunctivae normal.  Cardiovascular:     Rate and Rhythm: Normal rate and regular rhythm.     Heart sounds: Normal heart sounds. No murmur heard.  No friction rub. No gallop.   Pulmonary:     Effort: Pulmonary effort is normal. No respiratory distress.     Breath sounds: Normal breath sounds.  Abdominal:     General: There is no distension.     Palpations: Abdomen is soft.     Tenderness: There is abdominal  tenderness.     Comments: Mild tenderness in the epigastrium without rebound or guarding.  No distention.  Genitourinary:    Comments: Normal external male genitalia.  No concerning lesions noted.  No inguinal adenopathy.  No penile discharge.  There is some fullness of the left testicle/left hemiscrotum.  Nontender.  No discrete mass palpated.  No hernia. Musculoskeletal:        General: No tenderness.     Cervical back: Neck supple.  Skin:    General: Skin is warm and dry.  Neurological:     Mental Status: He is alert.  Psychiatric:        Behavior: Behavior normal.        Thought Content: Thought content normal.     ED Results / Procedures / Treatments   Labs (all labs ordered are listed, but only abnormal results are displayed) Labs Reviewed  COMPREHENSIVE METABOLIC PANEL - Abnormal; Notable for the following components:      Result Value   Potassium 3.4 (*)    Glucose, Bld 132 (*)    BUN 24 (*)    Creatinine, Ser 1.27 (*)    All other components within normal limits  URINALYSIS, ROUTINE W REFLEX MICROSCOPIC - Abnormal; Notable for the following components:   Color, Urine STRAW (*)    All other components within normal limits  LIPASE, BLOOD  CBC  GC/CHLAMYDIA PROBE AMP ([place]) NOT AT [facility]    EKG None  Radiology No results found.  Procedures Procedures (including critical care time)  Medications Ordered in ED Medications  sodium chloride flush (NS) 0.9 % injection 3 mL (0 mLs Intravenous Hold 07/26/19 0924)    ED Course  I have reviewed the triage vital signs and the nursing notes.  Pertinent labs & imaging results that were available during my care of the patient were reviewed by me and considered in my medical decision making (see chart for details).    MDM Rules/Calculators/A&P                          59 year old male with upper abdominal pain which has been ongoing for months.  Consistently worse after eating.  Ultrasound negative for  cholelithiasis.  He is concerned about PUD.  I think this is a reasonable concern.  Advised to follow-up with his PCP.  He states that he does not have the financial means to do so at this time.  Explained that we do not routinely do testing for H. pylori here in the  emergency room.    With regards to his testicular swelling, I suspect he may potentially have a varicocele or hydrocele.  Symptoms have been pretty constant for 2 months.  Highly doubt torsion.  I cannot feel discrete mass.  Exam is otherwise pretty unremarkable.  I do not feel that he needs emergent ultrasound.  He reports he is already screened and empirically treated for STDs through the health department.  No improvement of symptoms.  At this point, I feel that he needs urology follow-up.  Final Clinical Impression(s) / ED Diagnoses Final diagnoses:  Pain of upper abdomen  Testicular swelling    Rx / DC Orders ED Discharge Orders    None       Virgel Manifold, MD 07/26/19 1229

## 2019-07-29 LAB — GC/CHLAMYDIA PROBE AMP (~~LOC~~) NOT AT ARMC
Chlamydia: NEGATIVE
Comment: NEGATIVE
Comment: NORMAL
Neisseria Gonorrhea: NEGATIVE

## 2020-04-08 DIAGNOSIS — Z85820 Personal history of malignant melanoma of skin: Secondary | ICD-10-CM | POA: Insufficient documentation

## 2020-04-08 HISTORY — DX: Personal history of malignant melanoma of skin: Z85.820

## 2020-06-16 DIAGNOSIS — Z8619 Personal history of other infectious and parasitic diseases: Secondary | ICD-10-CM | POA: Insufficient documentation

## 2020-06-16 DIAGNOSIS — J342 Deviated nasal septum: Secondary | ICD-10-CM | POA: Insufficient documentation

## 2020-06-16 DIAGNOSIS — Z789 Other specified health status: Secondary | ICD-10-CM

## 2020-06-16 HISTORY — DX: Deviated nasal septum: J34.2

## 2020-06-16 HISTORY — DX: Personal history of other infectious and parasitic diseases: Z86.19

## 2020-06-16 HISTORY — DX: Other specified health status: Z78.9

## 2020-06-22 DIAGNOSIS — H6523 Chronic serous otitis media, bilateral: Secondary | ICD-10-CM | POA: Insufficient documentation

## 2020-06-22 HISTORY — DX: Chronic serous otitis media, bilateral: H65.23

## 2020-06-23 DIAGNOSIS — J3489 Other specified disorders of nose and nasal sinuses: Secondary | ICD-10-CM

## 2020-06-23 HISTORY — DX: Other specified disorders of nose and nasal sinuses: J34.89

## 2020-11-14 DIAGNOSIS — G589 Mononeuropathy, unspecified: Secondary | ICD-10-CM

## 2020-11-14 DIAGNOSIS — R198 Other specified symptoms and signs involving the digestive system and abdomen: Secondary | ICD-10-CM | POA: Insufficient documentation

## 2020-11-14 HISTORY — DX: Other specified symptoms and signs involving the digestive system and abdomen: R19.8

## 2020-11-14 HISTORY — DX: Mononeuropathy, unspecified: G58.9

## 2021-06-02 IMAGING — DX DG FEMUR 1V*L*
2 series · 2 of 2 positions shown · non-contrast
Comparison: None.

CLINICAL DATA: Left upper leg pain and swelling. The patient
reports he may have sustained a spider bite.

EXAM:
LEFT FEMUR 1 VIEW

[femur ap (1 of 2)]
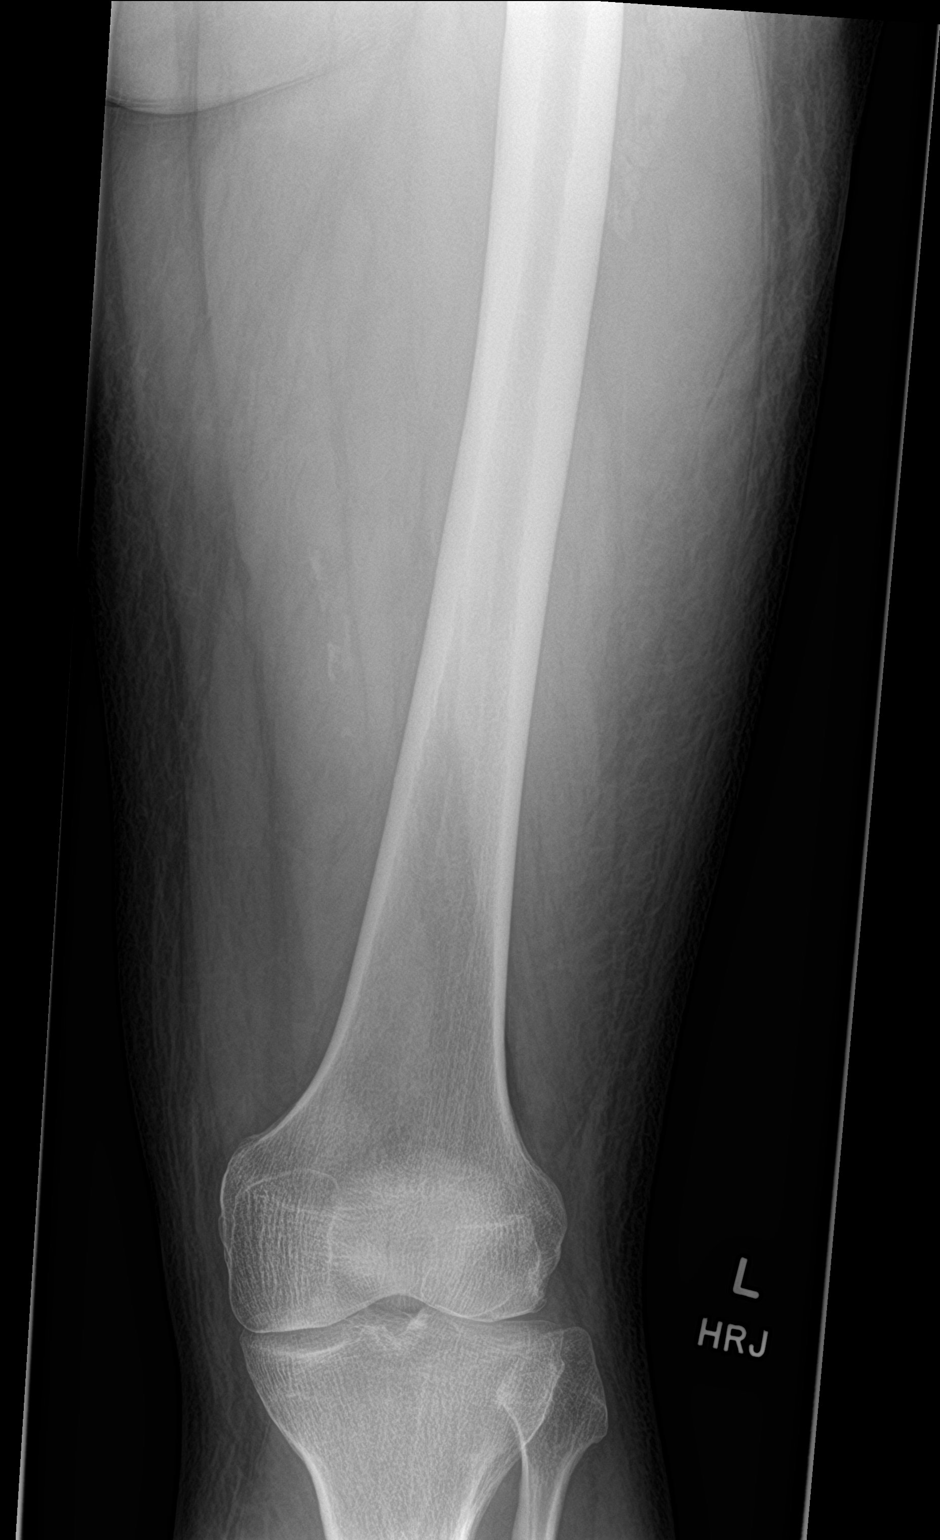

[femur ap (2 of 2)]
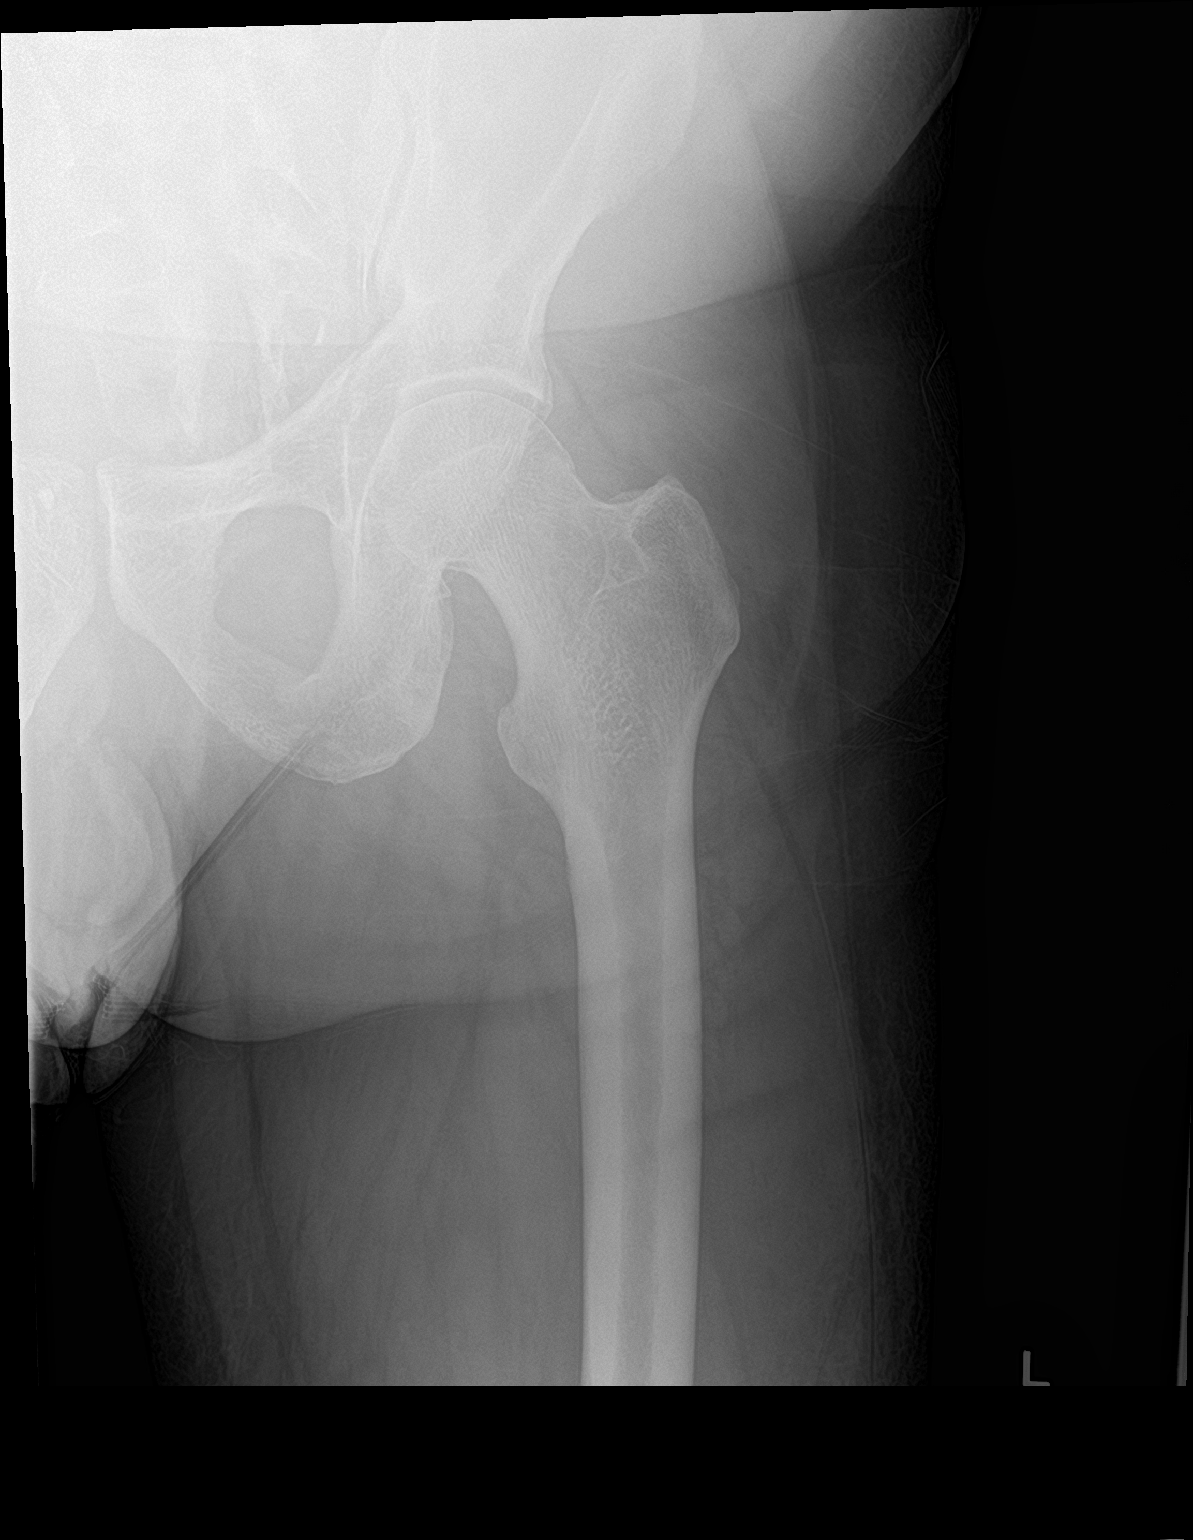

[2 of 2 positions shown; findings below may reference images not displayed]

FINDINGS: Subcutaneous fatty tissues appear infiltrated. No soft tissue gas or
radiopaque foreign body. No bony abnormality.
IMPRESSION: Infiltration of subcutaneous fatty tissues compatible with
cellulitis. Negative for soft tissue gas or foreign body.

## 2022-11-15 DIAGNOSIS — I639 Cerebral infarction, unspecified: Secondary | ICD-10-CM

## 2022-11-15 HISTORY — DX: Cerebral infarction, unspecified: I63.9

## 2022-12-10 DIAGNOSIS — E785 Hyperlipidemia, unspecified: Secondary | ICD-10-CM

## 2022-12-10 HISTORY — DX: Hyperlipidemia, unspecified: E78.5

## 2022-12-12 DIAGNOSIS — I6381 Other cerebral infarction due to occlusion or stenosis of small artery: Secondary | ICD-10-CM | POA: Insufficient documentation

## 2022-12-12 DIAGNOSIS — R200 Anesthesia of skin: Secondary | ICD-10-CM | POA: Insufficient documentation

## 2022-12-12 DIAGNOSIS — R202 Paresthesia of skin: Secondary | ICD-10-CM

## 2022-12-12 HISTORY — DX: Other cerebral infarction due to occlusion or stenosis of small artery: I63.81

## 2022-12-12 HISTORY — DX: Anesthesia of skin: R20.2

## 2023-01-02 DIAGNOSIS — R7303 Prediabetes: Secondary | ICD-10-CM | POA: Insufficient documentation

## 2023-01-02 HISTORY — DX: Prediabetes: R73.03

## 2023-01-04 DIAGNOSIS — Q2112 Patent foramen ovale: Secondary | ICD-10-CM

## 2023-01-04 HISTORY — DX: Patent foramen ovale: Q21.12

## 2023-01-16 DIAGNOSIS — Z6833 Body mass index (BMI) 33.0-33.9, adult: Secondary | ICD-10-CM

## 2023-01-16 HISTORY — DX: Body mass index (BMI) 33.0-33.9, adult: Z68.33

## 2023-02-02 DIAGNOSIS — D539 Nutritional anemia, unspecified: Secondary | ICD-10-CM | POA: Insufficient documentation

## 2023-02-02 HISTORY — DX: Nutritional anemia, unspecified: D53.9

## 2023-02-20 DIAGNOSIS — K029 Dental caries, unspecified: Secondary | ICD-10-CM | POA: Insufficient documentation

## 2023-02-20 HISTORY — DX: Dental caries, unspecified: K02.9

## 2023-03-02 DIAGNOSIS — R100 Acute abdomen: Secondary | ICD-10-CM | POA: Insufficient documentation

## 2023-03-02 HISTORY — DX: Acute abdomen: R10.0

## 2023-03-06 DIAGNOSIS — R1084 Generalized abdominal pain: Secondary | ICD-10-CM | POA: Insufficient documentation

## 2023-03-06 DIAGNOSIS — R933 Abnormal findings on diagnostic imaging of other parts of digestive tract: Secondary | ICD-10-CM | POA: Insufficient documentation

## 2023-03-06 DIAGNOSIS — Z8679 Personal history of other diseases of the circulatory system: Secondary | ICD-10-CM

## 2023-03-06 HISTORY — DX: Abnormal findings on diagnostic imaging of other parts of digestive tract: R93.3

## 2023-03-06 HISTORY — DX: Personal history of other diseases of the circulatory system: Z86.79

## 2023-03-06 HISTORY — DX: Generalized abdominal pain: R10.84

## 2023-03-21 DIAGNOSIS — R799 Abnormal finding of blood chemistry, unspecified: Secondary | ICD-10-CM

## 2023-03-21 HISTORY — DX: Abnormal finding of blood chemistry, unspecified: R79.9

## 2023-04-18 ENCOUNTER — Other Ambulatory Visit: Payer: Self-pay | Admitting: Otolaryngology

## 2023-04-18 DIAGNOSIS — R0981 Nasal congestion: Secondary | ICD-10-CM

## 2023-04-18 DIAGNOSIS — J339 Nasal polyp, unspecified: Secondary | ICD-10-CM

## 2023-05-10 ENCOUNTER — Other Ambulatory Visit: Payer: PRIVATE HEALTH INSURANCE

## 2023-05-12 ENCOUNTER — Other Ambulatory Visit: Payer: PRIVATE HEALTH INSURANCE

## 2023-06-02 ENCOUNTER — Ambulatory Visit
Admission: RE | Admit: 2023-06-02 | Discharge: 2023-06-02 | Disposition: A | Discharge status: Home / Self Care | Payer: PRIVATE HEALTH INSURANCE | Source: Ambulatory Visit | Attending: Otolaryngology | Admitting: Otolaryngology

## 2023-06-02 DIAGNOSIS — J339 Nasal polyp, unspecified: Secondary | ICD-10-CM

## 2023-06-02 DIAGNOSIS — R0981 Nasal congestion: Secondary | ICD-10-CM

## 2023-07-19 DIAGNOSIS — G629 Polyneuropathy, unspecified: Secondary | ICD-10-CM | POA: Insufficient documentation

## 2023-07-19 DIAGNOSIS — Z87448 Personal history of other diseases of urinary system: Secondary | ICD-10-CM | POA: Insufficient documentation

## 2023-07-19 DIAGNOSIS — I6930 Unspecified sequelae of cerebral infarction: Secondary | ICD-10-CM

## 2023-07-19 HISTORY — DX: Unspecified sequelae of cerebral infarction: I69.30

## 2023-07-19 HISTORY — DX: Polyneuropathy, unspecified: G62.9

## 2023-07-19 HISTORY — DX: Personal history of other diseases of urinary system: Z87.448

## 2023-08-24 DIAGNOSIS — Z85828 Personal history of other malignant neoplasm of skin: Secondary | ICD-10-CM

## 2023-08-24 HISTORY — DX: Personal history of other malignant neoplasm of skin: Z85.828

## 2023-08-24 LAB — LAB REPORT - SCANNED
Albumin, Urine POC: NORMAL
EGFR: 74.3

## 2023-09-04 ENCOUNTER — Ambulatory Visit: Payer: PRIVATE HEALTH INSURANCE

## 2023-09-04 ENCOUNTER — Ambulatory Visit

## 2023-09-04 ENCOUNTER — Other Ambulatory Visit: Payer: Self-pay

## 2023-09-04 VITALS — BP 120/78 | HR 77 | Ht 67.0 in | Wt 217.0 lb

## 2023-09-04 DIAGNOSIS — R0789 Other chest pain: Secondary | ICD-10-CM | POA: Insufficient documentation

## 2023-09-04 DIAGNOSIS — Z789 Other specified health status: Secondary | ICD-10-CM | POA: Insufficient documentation

## 2023-09-04 DIAGNOSIS — Q2112 Patent foramen ovale: Secondary | ICD-10-CM | POA: Diagnosis present

## 2023-09-04 DIAGNOSIS — E782 Mixed hyperlipidemia: Secondary | ICD-10-CM | POA: Diagnosis present

## 2023-09-04 DIAGNOSIS — I63511 Cerebral infarction due to unspecified occlusion or stenosis of right middle cerebral artery: Secondary | ICD-10-CM | POA: Diagnosis present

## 2023-09-04 DIAGNOSIS — R002 Palpitations: Secondary | ICD-10-CM

## 2023-09-04 DIAGNOSIS — I10 Essential (primary) hypertension: Secondary | ICD-10-CM | POA: Insufficient documentation

## 2023-09-04 DIAGNOSIS — I639 Cerebral infarction, unspecified: Secondary | ICD-10-CM | POA: Insufficient documentation

## 2023-09-04 HISTORY — DX: Cerebral infarction, unspecified: I63.9

## 2023-09-04 HISTORY — DX: Other chest pain: R07.89

## 2023-09-04 MED ORDER — METOPROLOL TARTRATE 100 MG PO TABS: 100.0000 mg | ORAL_TABLET | Freq: Once | ORAL | 0 refills | Status: DC

## 2023-09-04 NOTE — Assessment & Plan Note (Signed)
 Advised about harmful effects of alcohol consumption and recommended strongly to abstain or decrease intake.

## 2023-09-04 NOTE — Assessment & Plan Note (Signed)
 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  Event monitor for 28 days to rule out any significant atrial arrhythmias.

## 2023-09-04 NOTE — Patient Instructions (Signed)
 Medication Instructions:  Your physician recommends that you continue on your current medications as directed. Please refer to the Current Medication list given to you today.  *If you need a refill on your cardiac medications before your next appointment, please call your pharmacy*  Lab Work: Your physician recommends that you return for lab work in:   Labs today: BMP  If you have labs (blood work) drawn today and your tests are completely normal, you will receive your results only by: MyChart Message (if you have MyChart) OR A paper copy in the mail If you have any lab test that is abnormal or we need to change your treatment, we will call you to review the results.  Testing/Procedures: A zio monitor was ordered today. It will remain on for 28 days. You will then return monitor and event diary in provided box. It takes 1-2 weeks for report to be downloaded and returned to us . We will call you with the results. If monitor falls off or has orange flashing light, please call Zio for further instructions.     Your cardiac CT will be scheduled at one of the below locations:   [facility] [address] [place], [place] [address] [phone]  OR   [facility] [address] [place], [place] [address] [phone]  OR   [facility] [address] [place], [place] [address] [phone]  OR   [facility] and Vascular Tower [address]  [place], [place] [address]  OR   [facility] [address] [employer] [place], [place]  If scheduled at [facility], please arrive at the [facility] and Children's Entrance (Entrance C2) of [facility] 30 minutes prior to test start time. You can use the FREE valet parking offered at entrance C (encouraged to control the heart rate for the test)  Proceed to the [facility] Radiology Department (first floor) to check-in and test prep.  All radiology patients and  guests should use entrance C2 at [facility], accessed from [facility], even though the hospital's physical address listed is [address]  If scheduled at the Heart and Vascular Tower at [address], please enter the parking lot using the [address] entrance and use the FREE valet service at the patient drop-off area. Enter the buidling and check-in with registration on the main floor.  If scheduled at [facility] or [facility], please arrive 15 mins early for check-in and test prep.  There is spacious parking and easy access to the radiology department from the [facility] Heart and Vascular entrance. Please enter here and check-in with the desk attendant.   If scheduled at [facility], please arrive 30 minutes early for check-in and test prep.  Please follow these instructions carefully (unless otherwise directed):  An IV will be required for this test and Nitroglycerin will be given.  Hold all erectile dysfunction medications at least 3 days (72 hrs) prior to test. (Ie viagra, cialis, sildenafil, tadalafil, etc)   On the Night Before the Test: Be sure to Drink plenty of water. Do not consume any caffeinated/decaffeinated beverages or chocolate 12 hours prior to your test. Do not take any antihistamines 12 hours prior to your test.  On the Day of the Test: Drink plenty of water until 1 hour prior to the test. Do not eat any food 1 hour prior to test. You may take your regular  medications prior to the test.  Take metoprolol  (Lopressor ) two hours prior to test. If you take Hydrochlorothiazide  please HOLD on the morning of the test. Patients who wear a continuous glucose monitor MUST remove the device prior to scanning.      After the Test: Drink plenty of water. After receiving IV contrast, you may experience a mild flushed feeling. This is normal. On occasion, you may experience a mild rash up  to 24 hours after the test. This is not dangerous. If this occurs, you can take Benadryl 25 mg, Zyrtec, Claritin, or Allegra and increase your fluid intake. (Patients taking Tikosyn should avoid Benadryl, and may take Zyrtec, Claritin, or Allegra) If you experience trouble breathing, this can be serious. If it is severe call 911 IMMEDIATELY. If it is mild, please call our office.  We will call to schedule your test 2-4 weeks out understanding that some insurance companies will need an authorization prior to the service being performed.   For more information and frequently asked questions, please visit our website : [email or web address]  For non-scheduling related questions, please contact the cardiac imaging nurse navigator should you have any questions/concerns: Cardiac Imaging Nurse Navigators Direct Office Dial: [phone]   For scheduling needs, including cancellations and rescheduling, please call [place], [phone].    Follow-Up: At [facility], you and your health needs are our priority.  As part of our continuing mission to provide you with exceptional heart care, our providers are all part of one team.  This team includes your primary Cardiologist (physician) and Advanced Practice Providers or APPs (Physician Assistants and Nurse Practitioners) who all work together to provide you with the care you need, when you need it.  Your next appointment:   2 month(s)  Provider:   Alean Kobus, MD    We recommend signing up for the patient portal called MyChart.  Sign up information is provided on this After Visit Summary.  MyChart is used to connect with patients for Virtual Visits (Telemedicine).  Patients are able to view lab/test results, encounter notes, upcoming appointments, etc.  Non-urgent messages can be sent to your provider as well.   To learn more about what you can do with MyChart, go to [email or web address].   Other  Instructions None

## 2023-09-04 NOTE — Assessment & Plan Note (Signed)
 Lipid panel from 12/10/2022 total cholesterol 177, triglycerides 98, HDL 39, LDL 98. Continue atorvastatin 40 mg once daily. He remembers having had a lipid panel through PCPs office in the last few months.  I do not have this to review currently. Will try to obtain and review them at his subsequent follow-up visit if 1 not available we will request a fasting lipid panel.

## 2023-09-04 NOTE — Assessment & Plan Note (Signed)
 Well-controlled on current regimen with amlodipine , hydrochlorothiazide , losartan, metoprolol  succinate. Target blood pressure below 130/80 mmHg. Continue current doses amlodipine  5 mg once daily Hydrochlorothiazide  12.5 mg once daily Losartan 100 mg once daily Toprol -XL 25 mg once daily.

## 2023-09-04 NOTE — Progress Notes (Addendum)
 Cardiology Consultation:    Date:  09/04/2023   ID:  Chris Lynch, DOB September 17, 1960, MRN [id number]  PCP:  Nestor Elston NOVAK, NP  Cardiologist:  Alean JONELLE Kobus, MD   Referring MD: Nestor Elston NOVAK, NP   No chief complaint on file.    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 Transthoracic echocardiogram 12/12/2022 at [facility] reported normal LVEF 60 to 65%, normal RV size and function, agitated saline study with right-to-left atrial level shunt reported.   Problem List Items Addressed This Visit     Essential hypertension   Well-controlled on current regimen with amlodipine , hydrochlorothiazide , losartan, metoprolol  succinate. Target blood pressure below 130/80 mmHg. Continue current doses amlodipine  5 mg once daily Hydrochlorothiazide  12.5 mg once daily Losartan 100 mg once daily Toprol -XL 25 mg once daily.       Relevant Medications   amLODipine  (NORVASC ) 5 MG tablet   aspirin 325 MG tablet   atorvastatin (LIPITOR) 40 MG tablet   hydrochlorothiazide  (MICROZIDE ) 12.5 MG capsule   losartan (COZAAR) 100 MG tablet   metoprolol  succinate (TOPROL -XL) 25 MG 24 hr tablet   Moderate alcohol consumption   Advised about harmful effects of alcohol consumption and recommended strongly to abstain or decrease intake.      Hyperlipidemia   Lipid panel from 12/10/2022 total cholesterol 177, triglycerides 98, HDL 39, LDL 98. Continue atorvastatin 40 mg once daily. He remembers having had a lipid panel through PCPs office in the last few months.  I do not  have this to review currently. Will try to obtain and review them at his subsequent follow-up visit if 1 not available we will request a fasting lipid panel.       Relevant Medications   amLODipine  (NORVASC ) 5 MG tablet   aspirin 325 MG tablet   atorvastatin (LIPITOR) 40 MG tablet   hydrochlorothiazide  (MICROZIDE ) 12.5 MG capsule   losartan (COZAAR) 100 MG tablet   metoprolol  succinate (TOPROL -XL) 25 MG 24 hr tablet   Patent foramen ovale   Incidental identification of atrial level shunt on transthoracic echocardiogram October 2024 at [facility]. In the setting of thalamic stroke right-sided.  However given thalamic stroke and not a cortical stroke not a candidate for PFO closure at this time.  Will obtain heart rhythm monitoring for 30 days to rule out any significant underlying atrial arrhythmias.      Relevant Medications   amLODipine  (NORVASC ) 5 MG tablet   aspirin 325 MG tablet   atorvastatin (LIPITOR) 40 MG tablet   hydrochlorothiazide  (MICROZIDE ) 12.5 MG capsule   losartan (COZAAR) 100 MG tablet   metoprolol  succinate (TOPROL -XL) 25 MG 24 hr tablet   Chest tightness - Primary   Symptoms of chest tightness intermittently at times with exertion and at times at rest. Does have significant cardiovascular risk factors. EKG changes from PCPs office visit 08/24/2023 reviewed with mild prolonged PR interval and nonspecific ST-T changes with intraventricular conduction delay.  In the setting will further assess for any significant coronary artery disease with cardiac CT coronary angiogram.  Differential diagnosis for symptoms could be arrhythmias, will  assess with Zio patch as reviewed below under PFO given his history of CVA. Will obtain this for 28 days.      Relevant Orders   LONG TERM MONITOR XT (3-14 DAYS)   CVA (cerebral vascular accident) (HCC)   Right thalamic small stroke with residual left-sided upper and lower extremity numbness.  Event monitor for 28 days to rule  out any significant atrial arrhythmias.      Relevant Medications   amLODipine  (NORVASC ) 5 MG tablet   aspirin 325 MG tablet   atorvastatin (LIPITOR) 40 MG tablet   hydrochlorothiazide  (MICROZIDE ) 12.5 MG capsule   losartan (COZAAR) 100 MG tablet   metoprolol  succinate (TOPROL -XL) 25 MG 24 hr tablet   Other Relevant Orders   LONG TERM MONITOR XT (3-14 DAYS)    Return to clinic tentatively in 2 months.  Addendum 10/02/2023: Patient returned on Zio patch is the first of the 14-day patch fell off and he did not want want to continue the second patch. He preferred to proceed with loop recorder, to be scheduled for loop recorder placement referral to be placed.  History of Present Illness:    Chris Lynch is a 63 y.o. male who is being seen today for the evaluation of abnormal EKG changes at the request of Tetter, Devin B, NP. Had a cardiology visit 02/01/2023 at [facility] with Dr. 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 Transthoracic echocardiogram 12/12/2022 at [facility] reported normal LVEF 60 to 65%, normal RV size and function, agitated saline study with right-to-left atrial level shunt reported.   Labs from 12/14/2022, hemoglobin 13.2, hematocrit 38.2, platelets 244 and WBC 8.18. BUN 24, creatinine 0.89 EGFR greater than 90.  lipid panel 12/10/2022 with total cholesterol 177,  triglycerides 98, HDL 39 and LDL 98. Hemoglobin A1c 5.8.  Blood work from 08/24/2023 with sodium 142, potassium 3.2 BUN 24, creatinine 1.12 and EGFR 74.3. Normal transaminases and alkaline phosphatase.  EKG from PCPs office 08/24/2023 noted normal sinus rhythm with heart rate 55/min, PR interval mildly prolonged at 250 ms, QRS duration 118 ms, nonspecific intraventricular conduction delay.  Here for the visit by himself.  Lives with his girlfriend at home.  Mentions he is retired.  From cardiac standpoint mentions atypical symptoms of chest tightness that can occur randomly or with exertion.  Relieves with rest.  No significant shortness of breath.  No palpitation, lightheadedness, dizziness. Does report getting tired easily.  Walking without any support but does report numbness on the left upper and lower extremity.  Good compliance with medications. Mentions blood pressures have improved significantly.    Past Medical History:  Diagnosis Date   Hepatitis C virus infection cured after interferon therapy    Hypertension    Stroke (HCC) 11/2022   [facility]    Past Surgical History:  Procedure Laterality Date   IRRIGATION AND DEBRIDEMENT KNEE Left 01/08/2019   Procedure: IRRIGATION AND DEBRIDEMENT OF PREPATELLAR LEFT KNEE;  Surgeon: Jerri Kay HERO, MD;  Location: [facility] OR;  Service: Orthopedics;  Laterality: Left;    Current Medications: Current Meds  Medication Sig   amLODipine  (NORVASC ) 5 MG tablet Take 5 mg by mouth 2 (two) times daily.   aspirin 325 MG tablet Take 325 mg by mouth daily.   atorvastatin (LIPITOR) 40 MG tablet Take 40 mg by mouth daily.   cyanocobalamin (VITAMIN B12) 1000 MCG tablet Take 1 tablet by mouth daily.   fluticasone (FLONASE) 50 MCG/ACT nasal spray Place 2 sprays into both nostrils daily.   losartan (COZAAR) 100 MG tablet Take 100 mg by mouth daily.   omeprazole (PRILOSEC) 20 MG capsule Take 20 mg by mouth daily.   Potassium Chloride  ER 20 MEQ TBCR  Take 20 mEq by mouth daily.   pregabalin (LYRICA) 100 MG capsule Take 100 mg by mouth 3 (three) times daily.     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 : Never true  Transportation Needs: No Transportation Needs (12/11/2022)   Received from [facility]    In the past 12 months, has lack of reliable transportation kept you from medical appointments, meetings, work or from getting things needed for daily living? : No  Physical Activity: Not on file  Stress: Not on file  Social Connections: Not on file     Family History: The patient's family history includes Brain cancer in his mother and sister; Heart attack in his father; Stroke in his father; Throat cancer in his brother. ROS:   Please see the history of present illness.    All 14 point review of systems negative except as described per history of present illness.  EKGs/Labs/Other Studies Reviewed:    The following studies were reviewed today:   EKG:       Recent  Labs: No results found for requested labs within last 365 days.  Recent Lipid Panel No results found for: CHOL, TRIG, HDL, CHOLHDL, VLDL, LDLCALC, LDLDIRECT  Physical Exam:    VS:  BP 120/78   Pulse 77   Ht 5' 7 (1.702 m)   Wt 217 lb (98.4 kg)   SpO2 97%   BMI 33.99 kg/m     Wt Readings from Last 3 Encounters:  09/04/23 217 lb (98.4 kg)  07/26/19 220  lb (99.8 kg)  01/28/19 210 lb (95.3 kg)     GENERAL:  Well nourished, well developed in no acute distress NECK: No JVD; No carotid bruits CARDIAC: RRR, S1 and S2 present, no murmurs, no rubs, no gallops CHEST:  Clear to auscultation without rales, wheezing or rhonchi  Extremities: Trace bilateral pitting pedal edema since starting amlodipine . Pulses bilaterally symmetric with radial 2+ and dorsalis pedis 2+ NEUROLOGIC:  Alert and oriented x 3  Medication Adjustments/Labs and Tests Ordered: Current medicines are reviewed at length with the patient today.  Concerns regarding medicines are outlined above.  Orders Placed This Encounter  Procedures   LONG TERM MONITOR XT (3-14 DAYS)   No orders of the defined types were placed in this encounter.   Signed, Alean jess Kobus, MD, MPH, [facility]. 09/04/2023 2:39 PM    [facility]

## 2023-09-04 NOTE — Assessment & Plan Note (Addendum)
 Symptoms of chest tightness intermittently at times with exertion and at times at rest. Does have significant cardiovascular risk factors. EKG changes from PCPs office visit 08/24/2023 reviewed with mild prolonged PR interval and nonspecific ST-T changes with intraventricular conduction delay.  In the setting will further assess for any significant coronary artery disease with cardiac CT coronary angiogram.  Differential diagnosis for symptoms could be arrhythmias, will assess with Zio patch as reviewed below under PFO given his history of CVA. Will obtain this for 28 days.

## 2023-09-04 NOTE — Assessment & Plan Note (Signed)
 Incidental identification of atrial level shunt on transthoracic echocardiogram October 2024 at [facility]. In the setting of thalamic stroke right-sided.  However given thalamic stroke and not a cortical stroke not a candidate for PFO closure at this time.  Will obtain heart rhythm monitoring for 30 days to rule out any significant underlying atrial arrhythmias.

## 2023-09-05 LAB — BASIC METABOLIC PANEL WITH GFR
BUN/Creatinine Ratio: 22 (ref 10–24)
BUN: 31 mg/dL — ABNORMAL HIGH (ref 8–27)
CO2: 20 mmol/L (ref 20–29)
Calcium: 9.1 mg/dL (ref 8.6–10.2)
Chloride: 102 mmol/L (ref 96–106)
Creatinine, Ser: 1.4 mg/dL — ABNORMAL HIGH (ref 0.76–1.27)
Glucose: 69 mg/dL — ABNORMAL LOW (ref 70–99)
Potassium: 3.4 mmol/L — ABNORMAL LOW (ref 3.5–5.2)
Sodium: 139 mmol/L (ref 134–144)
eGFR: 57 mL/min/1.73 — ABNORMAL LOW (ref >59)

## 2023-09-29 ENCOUNTER — Encounter (HOSPITAL_BASED_OUTPATIENT_CLINIC_OR_DEPARTMENT_OTHER): Payer: Self-pay | Admitting: Radiology

## 2023-10-02 ENCOUNTER — Encounter (HOSPITAL_BASED_OUTPATIENT_CLINIC_OR_DEPARTMENT_OTHER): Payer: Self-pay | Admitting: Radiology

## 2023-10-02 ENCOUNTER — Telehealth: Payer: Self-pay

## 2023-10-02 DIAGNOSIS — I63511 Cerebral infarction due to unspecified occlusion or stenosis of right middle cerebral artery: Secondary | ICD-10-CM

## 2023-10-02 NOTE — Telephone Encounter (Signed)
 Patient came to office after his first Zio falling off and Zio sending him another one.  Patient stated he did not want to wear the other one as he sweats too much.  Olam Lesches, RN spoke with patient asking him to either wear the monitor for 14 days or have a loop recorder placed per Dr. Madireddy's recommendations.  Patient would like to have a loop recorder placed. I have placed the order for the loop recorder.

## 2023-10-04 ENCOUNTER — Telehealth (HOSPITAL_COMMUNITY): Payer: Self-pay | Admitting: *Deleted

## 2023-10-04 NOTE — Telephone Encounter (Signed)
 Reaching out to patient to offer assistance regarding upcoming cardiac imaging study; pt verbalizes understanding of appt date/time, parking situation and where to check in, pre-test NPO status and verified current allergies; name and call back number provided for further questions should they arise  Chantal Requena RN Navigator Cardiac Imaging [facility] [phone] office [phone] cell  Patient to take his daily metoprolol .

## 2023-10-05 ENCOUNTER — Ambulatory Visit (INDEPENDENT_AMBULATORY_CARE_PROVIDER_SITE_OTHER)
Admission: RE | Admit: 2023-10-05 | Discharge: 2023-10-05 | Disposition: A | Discharge status: Home / Self Care | Payer: PRIVATE HEALTH INSURANCE | Source: Ambulatory Visit

## 2023-10-05 DIAGNOSIS — I10 Essential (primary) hypertension: Secondary | ICD-10-CM

## 2023-10-05 DIAGNOSIS — Z789 Other specified health status: Secondary | ICD-10-CM

## 2023-10-05 DIAGNOSIS — R0789 Other chest pain: Secondary | ICD-10-CM

## 2023-10-05 DIAGNOSIS — E782 Mixed hyperlipidemia: Secondary | ICD-10-CM | POA: Diagnosis not present

## 2023-10-05 DIAGNOSIS — Q2112 Patent foramen ovale: Secondary | ICD-10-CM

## 2023-10-05 DIAGNOSIS — I63511 Cerebral infarction due to unspecified occlusion or stenosis of right middle cerebral artery: Secondary | ICD-10-CM

## 2023-10-05 DIAGNOSIS — R072 Precordial pain: Secondary | ICD-10-CM | POA: Diagnosis not present

## 2023-10-05 MED ORDER — METOPROLOL TARTRATE 5 MG/5ML IV SOLN: 10.0000 mg | Freq: Once | INTRAVENOUS | Status: DC | PRN

## 2023-10-05 MED ORDER — IOHEXOL 350 MG/ML SOLN
95.0000 mL | Freq: Once | INTRAVENOUS | Status: AC | PRN
  Administered 2023-10-05: 95 mL via INTRAVENOUS

## 2023-10-05 MED ORDER — DILTIAZEM HCL 25 MG/5ML IV SOLN: 10.0000 mg | INTRAVENOUS | Status: DC | PRN

## 2023-10-05 MED ORDER — NITROGLYCERIN 0.4 MG SL SUBL
0.8000 mg | SUBLINGUAL_TABLET | Freq: Once | SUBLINGUAL | Status: AC
  Administered 2023-10-05: 0.8 mg via SUBLINGUAL

## 2023-10-06 ENCOUNTER — Other Ambulatory Visit: Payer: Self-pay | Admitting: Cardiology

## 2023-10-06 ENCOUNTER — Ambulatory Visit: Payer: Self-pay

## 2023-10-06 ENCOUNTER — Ambulatory Visit (HOSPITAL_BASED_OUTPATIENT_CLINIC_OR_DEPARTMENT_OTHER)
Admission: RE | Admit: 2023-10-06 | Discharge: 2023-10-06 | Disposition: A | Discharge status: Home / Self Care | Source: Ambulatory Visit | Attending: Cardiology | Admitting: Cardiology

## 2023-10-06 DIAGNOSIS — R931 Abnormal findings on diagnostic imaging of heart and coronary circulation: Secondary | ICD-10-CM

## 2023-10-06 NOTE — Progress Notes (Signed)
 Please inform him that his significant amount of plaque buildup in the blood vessels that supply the heart and there are areas with severe amount of narrowing which appear to impede blood flow to the heart. This is likely contributing to his symptoms of chest discomfort and tiredness. Will further require evaluation with cardiac catheterization, we can discuss this at office visit and schedule him for cardiac cath if he is agreeable. Please schedule him for office visit with me at the earliest, ideally early next week. Advised him to avoid any moderate to heavy exertion. Thank you.

## 2023-10-06 NOTE — Progress Notes (Signed)
 FFR Order

## 2023-10-06 NOTE — Telephone Encounter (Signed)
 Error- please disregard

## 2023-10-09 ENCOUNTER — Telehealth: Payer: Self-pay

## 2023-10-09 NOTE — Telephone Encounter (Signed)
Pt returning nurses call regarding results. Please advise 

## 2023-10-11 ENCOUNTER — Ambulatory Visit: Payer: Self-pay | Admitting: Cardiology

## 2023-10-13 ENCOUNTER — Ambulatory Visit

## 2023-10-13 ENCOUNTER — Ambulatory Visit: Attending: Cardiology | Admitting: Cardiology

## 2023-10-13 ENCOUNTER — Encounter: Payer: Self-pay | Admitting: *Deleted

## 2023-10-13 VITALS — BP 124/84 | HR 86 | Ht 67.0 in | Wt 216.0 lb

## 2023-10-13 DIAGNOSIS — E782 Mixed hyperlipidemia: Secondary | ICD-10-CM | POA: Diagnosis present

## 2023-10-13 DIAGNOSIS — Z01812 Encounter for preprocedural laboratory examination: Secondary | ICD-10-CM | POA: Diagnosis present

## 2023-10-13 DIAGNOSIS — I10 Essential (primary) hypertension: Secondary | ICD-10-CM | POA: Diagnosis present

## 2023-10-13 DIAGNOSIS — Q2112 Patent foramen ovale: Secondary | ICD-10-CM | POA: Diagnosis present

## 2023-10-13 DIAGNOSIS — I25118 Atherosclerotic heart disease of native coronary artery with other forms of angina pectoris: Secondary | ICD-10-CM

## 2023-10-13 DIAGNOSIS — Z789 Other specified health status: Secondary | ICD-10-CM

## 2023-10-13 MED ORDER — NITROGLYCERIN 0.4 MG SL SUBL: 0.4000 mg | SUBLINGUAL_TABLET | SUBLINGUAL | 3 refills | Status: AC | PRN

## 2023-10-13 NOTE — Patient Instructions (Addendum)
 Medication Instructions:  Your physician recommends that you continue on your current medications as directed. Please refer to the Current Medication list given to you today.  *If you need a refill on your cardiac medications before your next appointment, please call your pharmacy*   Testing/Procedures:  [facility] A DEPT OF [facility] [place] [address] [place] [address] Dept: [phone] Loc: [phone]  Chris Lynch  10/13/2023  You are scheduled for a Cardiac Catheterization on Thursday, September 4 with Dr. [name].  1. Please arrive at the [facility] (Main Entrance A) at [facility]: [address] [place], [place] [address] at 8:30 AM (This time is 2 hour(s) before your procedure to ensure your preparation).   Free valet parking service is available. You will check in at ADMITTING. The support person will be asked to wait in the waiting room.  It is OK to have someone drop you off and come back when you are ready to be discharged.    Special note: Every effort is made to have your procedure done on time. Please understand that emergencies sometimes delay scheduled procedures.  2. Diet: Nothing to eat after midnight.   3. Hydration: You need to be well hydrated before your procedure. On September 3, you may drink approved liquids (see below) until 2 hours before the procedure, with 16 oz of water as your last intake.   List of approved liquids water, clear juice, clear tea, black coffee, fruit juices, non-citric and without pulp, carbonated beverages, Gatorade, Kool -Aid, plain Jello-O and plain ice popsicles.  4. Labs: You will need to have blood drawn on , August 29 at [facility]: [address], [place] . You do not need to be fasting.  5. Medication instructions in preparation for your procedure:   Contrast Allergy: No    Hold Losartan 24 hours prior to Cath  Hold  Hydrochlorothiazide  day of cath  [facility] starting onMonday, September 1, until after Cath Hold Aspirin 325 and take the 81 mg morning of cath On the morning of your procedure, take your Aspirin 81 mg and any morning medicines NOT listed above.  You may use sips of water.  6. Plan to go home the same day, you will only stay overnight if medically necessary. 7. Bring a current list of your medications and current insurance cards. 8. You MUST have a responsible person to drive you home. 9. Someone MUST be with you the first 24 hours after you arrive home or your discharge will be delayed. 10. Please wear clothes that are easy to get on and off and wear slip-on shoes.  Thank you for allowing us  to care for you!   -- [facility]   Follow-Up: At [facility], you and your health needs are our priority.  As part of our continuing mission to provide you with exceptional heart care, our providers are all part of one team.  This team includes your primary Cardiologist (physician) and Advanced Practice Providers or APPs (Physician Assistants and Nurse Practitioners) who all work together to provide you with the care you need, when you need it.  Your next appointment:   4 week(s)  Provider:   Delon Hoover, NP [place])  We recommend signing up for the patient portal called MyChart.  Sign up information is provided on this After Visit Summary.  MyChart is used to connect with patients for  Virtual Visits (Telemedicine).  Patients are able to view lab/test results, encounter notes, upcoming appointments, etc.  Non-urgent messages can be sent to your provider as well.   To learn more about what you can do with MyChart, go to [email or web address].   Other Instructions

## 2023-10-13 NOTE — H&P (View-Only) (Signed)
 Cardiology Office Note   Date:  10/13/2023  ID:  Chris Lynch, DOB 09-Mar-1960, MRN [id number] PCP: Nestor Elston NOVAK, NP  [facility] Providers Cardiologist:  Alean SAUNDERS Madireddy, MD     History of Present Illness Chris Lynch is a 63 y.o. male with a past medical history of thymic stroke, dyslipidemia, hypertension, prior tobacco abuse, GERD, alcohol abuse, PFO.  Coronary CTA 10/05/2023 calcium score 1307, 97th percentile, extensive TPV, FFR demonstrated high likelihood of hemodynamically significant stenosis in distal LM, mid RCA 12/12/2022 echo EF 60 to 65%, impaired relaxation, right-to-left interatrial shunt  He established care with Dr. Liborio in August 2025 for the evaluation of chest pain.  A coronary CTA was arranged revealing a calcium score of 13.7, 97th percentile, FFR demonstrated high likelihood of hemodynamically significant stenosis in the distal left main and mid RCA.  He presents today for follow-up after his abnormal coronary CTA.  He describes episodes of stable angina, states his episode of his chest pain  are not bad, but he frequently has to stop what he is doing to rest.  He also has associated fatigue and DOE. He denies palpitations,  pnd, orthopnea, n, v, dizziness, syncope, edema, weight gain, or early satiety.    ROS: Review of Systems  Constitutional:  Positive for malaise/fatigue.  Respiratory:  Positive for shortness of breath.   Cardiovascular:  Positive for chest pain.  All other systems reviewed and are negative.    Studies Reviewed EKG Interpretation Date/Time:  Friday October 13 2023 13:53:46 EDT Ventricular Rate:  67 PR Interval:  232 QRS Duration:  106 QT Interval:  402 QTC Calculation: 424 R Axis:   1  Text Interpretation: Sinus rhythm with 1st degree A-V block Left ventricular hypertrophy with repolarization abnormality ( R in aVL ) Cannot rule out Septal infarct , age undetermined When compared with ECG of 08-Jan-2019 07:02,  PREVIOUS ECG IS PRESENT Confirmed by Carlin Nest [phone]) on 10/13/2023 1:56:07 PM    Cardiac Studies & Procedures   ______________________________________________________________________________________________          CT SCANS  CT CORONARY MORPH W/CTA COR W/SCORE 10/05/2023  Addendum 10/06/2023 11:35 PM ADDENDUM REPORT: 10/06/2023 23:33  EXAM: OVER-READ INTERPRETATION  CT CHEST  The following report is an over-read performed by radiologist Dr. Suzen Dials of [facility] on 10/06/2023. This over-read does not include interpretation of cardiac or coronary anatomy or pathology. The coronary calcium score/coronary CTA interpretation by the cardiologist is attached.  COMPARISON:  None.  FINDINGS: Cardiovascular: There are no significant extracardiac vascular findings.  Mediastinum/Nodes: There are no enlarged lymph nodes within the visualized mediastinum.  Lungs/Pleura: There is no pleural effusion. The visualized lungs appear clear.  Upper abdomen: No significant findings in the visualized upper abdomen.  Musculoskeletal/Chest wall: No chest wall mass or suspicious osseous findings within the visualized chest.  IMPRESSION: No significant extracardiac findings within the visualized chest.   Electronically Signed By: Suzen Dials M.D. On: 10/06/2023 23:33  Narrative CLINICAL DATA:  CP  EXAM: Cardiac/Coronary  CTA  TECHNIQUE: The patient was scanned on a GE Apex scanner.  FINDINGS: A 120 kV prospective scan was triggered in the descending thoracic aorta at 111 HU's. Axial non-contrast 3 mm slices were carried out through the heart. The data set was analyzed on a dedicated work station and scored using the Agatson method. Gantry rotation speed was 250 msecs and collimation was .6 mm. No beta blockade and 0.8 mg of sl NTG was  given. The 3D data set was reconstructed at 75% of the R-R cycle. Diastolic phases were analyzed on a  dedicated work station using MPR, MIP and VRT modes. The patient received 80 cc of contrast.  Aorta: Normal size.  Mild calcifications.  No dissection.  Aortic Valve:  Trileaflet.  No calcifications.  Coronary Arteries:  Normal coronary origin.  Right dominance.  RCA is a large dominant artery that gives rise to PDA and PLA. This artery is diffusely diseased in its proximal and mid portion with numerous mostly calcified plaques with moderate stenosis of 50-69%. PDA has mostly soft plaque in its proximal portion with mild stenosis of 25-49%.  Left main is a large artery that gives rise to LAD and LCX arteries as well as moderate size Intermediate Branch. This artery has multiple calcified plaques with mixed plaque in its distal portion and potentially moderate stenosis of 50-69%.  LAD is a large vessel is diffusely diseased in its proximal and mid portion with numerous mostly calcified plaques. At least moderate stenosis of 50-69% is suspected. This artery gives rise to moderate size D1 and D2. D2 has mixed plaque in its proximal portion with mild stenosis of 25-49%.  Intermediate Branch is diffusely diseased in its proximal portion with moderate stenosis of 50-69%.  LCX is a non-dominant artery that gives rise to one large OM1 branch. This artery is diffusely diseased in its proximal and mid portion with numerous mixed plaques with moderate stenosis of 50-69% .  Other findings:  Normal pulmonary vein drainage into the left atrium. Rt middle pulmonary vein noted - normal variant.  Normal left atrial appendage without a thrombus.  Normal size of the pulmonary artery.  IMPRESSION: 1. Coronary calcium score of 1307. This was 15 percentile for age and sex matched control.  2. Normal coronary origin with right dominance.  3. CAD-RADS 4 Severe stenosis. (> 50% left main), diffuse advanced disease with multiple at least moderate stenosis. Cardiac catheterization or CT FFR  is recommended. Consider symptom-guided anti-ischemic pharmacotherapy as well as risk factor modification per guideline directed care.  4. Plaque analysis: TPV 1826 mm3, 96th percentile, calcified plaque 387 mm3, non calcified plaque 1439 mm3. Extensive TPV.  Electronically Signed: By: Lamar Fitch M.D. On: 10/06/2023 17:46     ______________________________________________________________________________________________      Risk Assessment/Calculations           Physical Exam VS:  BP 124/84   Pulse 86   Ht 5' 7 (1.702 m)   Wt 216 lb (98 kg)   SpO2 96%   BMI 33.83 kg/m        Wt Readings from Last 3 Encounters:  10/13/23 216 lb (98 kg)  09/04/23 217 lb (98.4 kg)  07/26/19 220 lb (99.8 kg)    GEN: Well nourished, well developed in no acute distress NECK: No JVD; No carotid bruits CARDIAC: RRR, no murmurs, rubs, gallops RESPIRATORY:  Clear to auscultation without rales, wheezing or rhonchi  ABDOMEN: Soft, non-tender, non-distended EXTREMITIES:  No edema; No deformity   ASSESSMENT AND PLAN CAD - coronary CTA revealed calcium score 1307, 97th percentile, extensive TPV, FFR demonstrated high likelihood of hemodynamically significant stenosis in distal LM, mid RCA. He is having episodes of stable angina. We discussed proceeding with a cardiac catheterization, including risks versus benefits including 1 in 200 chance of major bleeding, 1 in 500 chance of vascular injury, transient kidney damage, arrhythmias, 1 in 1000 chance of MI, stroke, death, he is agreeable to proceed with  cardiac cath.  Currently on aspirin 325 mg daily but will change to 81 mg daily on the day of his procedure.  Will send in nitroglycerin  for him, explained how to use this and when to present to the emergency department. CBC, CMP today.   Hypertension-blood pressure is controlled today 124/84, continue Norvasc  5 mg daily, hydrochlorothiazide  12.5 mg daily, losartan 100 mg daily, metoprolol  25 mg  daily.  Dyslipidemia-will check direct LDL, LP(a) today, continue Lipitor 40 mg daily, will intensify regimen based on test results.  Prior tobacco abuse-cessation x 20 years.  Current alcohol use-he drinks 3-4 beers at least 4 days of the week, we discussed this in detail and his need to cut back, he plans to do so gradually.  History of stroke with PFO - on aspirin 325 mg daily and Lipitor.       Informed Consent   Shared Decision Making/Informed Consent The risks [stroke (1 in 1000), death (1 in 1000), kidney failure [usually temporary] (1 in 500), bleeding (1 in 200), allergic reaction [possibly serious] (1 in 200)], benefits (diagnostic support and management of coronary artery disease) and alternatives of a cardiac catheterization were discussed in detail with Chris Lynch and he is willing to proceed.     Dispo: CBC, CMP, direct LDL, LP(a), left heart catheterization, follow up in 4 weeks.   Signed, Delon JAYSON Hoover, NP

## 2023-10-13 NOTE — Progress Notes (Signed)
 Cardiology Office Note   Date:  10/13/2023  ID:  QUINTARIUS FERNS, DOB 09-Mar-1960, MRN [id number] PCP: Nestor Elston NOVAK, NP  [facility] Providers Cardiologist:  Alean SAUNDERS Madireddy, MD     History of Present Illness Chris Lynch is a 63 y.o. male with a past medical history of thymic stroke, dyslipidemia, hypertension, prior tobacco abuse, GERD, alcohol abuse, PFO.  Coronary CTA 10/05/2023 calcium score 1307, 97th percentile, extensive TPV, FFR demonstrated high likelihood of hemodynamically significant stenosis in distal LM, mid RCA 12/12/2022 echo EF 60 to 65%, impaired relaxation, right-to-left interatrial shunt  He established care with Dr. Liborio in August 2025 for the evaluation of chest pain.  A coronary CTA was arranged revealing a calcium score of 13.7, 97th percentile, FFR demonstrated high likelihood of hemodynamically significant stenosis in the distal left main and mid RCA.  He presents today for follow-up after his abnormal coronary CTA.  He describes episodes of stable angina, states his episode of his chest pain  are not bad, but he frequently has to stop what he is doing to rest.  He also has associated fatigue and DOE. He denies palpitations,  pnd, orthopnea, n, v, dizziness, syncope, edema, weight gain, or early satiety.    ROS: Review of Systems  Constitutional:  Positive for malaise/fatigue.  Respiratory:  Positive for shortness of breath.   Cardiovascular:  Positive for chest pain.  All other systems reviewed and are negative.    Studies Reviewed EKG Interpretation Date/Time:  Friday October 13 2023 13:53:46 EDT Ventricular Rate:  67 PR Interval:  232 QRS Duration:  106 QT Interval:  402 QTC Calculation: 424 R Axis:   1  Text Interpretation: Sinus rhythm with 1st degree A-V block Left ventricular hypertrophy with repolarization abnormality ( R in aVL ) Cannot rule out Septal infarct , age undetermined When compared with ECG of 08-Jan-2019 07:02,  PREVIOUS ECG IS PRESENT Confirmed by Carlin Nest [phone]) on 10/13/2023 1:56:07 PM    Cardiac Studies & Procedures   ______________________________________________________________________________________________          CT SCANS  CT CORONARY MORPH W/CTA COR W/SCORE 10/05/2023  Addendum 10/06/2023 11:35 PM ADDENDUM REPORT: 10/06/2023 23:33  EXAM: OVER-READ INTERPRETATION  CT CHEST  The following report is an over-read performed by radiologist Dr. Suzen Dials of [facility] on 10/06/2023. This over-read does not include interpretation of cardiac or coronary anatomy or pathology. The coronary calcium score/coronary CTA interpretation by the cardiologist is attached.  COMPARISON:  None.  FINDINGS: Cardiovascular: There are no significant extracardiac vascular findings.  Mediastinum/Nodes: There are no enlarged lymph nodes within the visualized mediastinum.  Lungs/Pleura: There is no pleural effusion. The visualized lungs appear clear.  Upper abdomen: No significant findings in the visualized upper abdomen.  Musculoskeletal/Chest wall: No chest wall mass or suspicious osseous findings within the visualized chest.  IMPRESSION: No significant extracardiac findings within the visualized chest.   Electronically Signed By: Suzen Dials M.D. On: 10/06/2023 23:33  Narrative CLINICAL DATA:  CP  EXAM: Cardiac/Coronary  CTA  TECHNIQUE: The patient was scanned on a GE Apex scanner.  FINDINGS: A 120 kV prospective scan was triggered in the descending thoracic aorta at 111 HU's. Axial non-contrast 3 mm slices were carried out through the heart. The data set was analyzed on a dedicated work station and scored using the Agatson method. Gantry rotation speed was 250 msecs and collimation was .6 mm. No beta blockade and 0.8 mg of sl NTG was  given. The 3D data set was reconstructed at 75% of the R-R cycle. Diastolic phases were analyzed on a  dedicated work station using MPR, MIP and VRT modes. The patient received 80 cc of contrast.  Aorta: Normal size.  Mild calcifications.  No dissection.  Aortic Valve:  Trileaflet.  No calcifications.  Coronary Arteries:  Normal coronary origin.  Right dominance.  RCA is a large dominant artery that gives rise to PDA and PLA. This artery is diffusely diseased in its proximal and mid portion with numerous mostly calcified plaques with moderate stenosis of 50-69%. PDA has mostly soft plaque in its proximal portion with mild stenosis of 25-49%.  Left main is a large artery that gives rise to LAD and LCX arteries as well as moderate size Intermediate Branch. This artery has multiple calcified plaques with mixed plaque in its distal portion and potentially moderate stenosis of 50-69%.  LAD is a large vessel is diffusely diseased in its proximal and mid portion with numerous mostly calcified plaques. At least moderate stenosis of 50-69% is suspected. This artery gives rise to moderate size D1 and D2. D2 has mixed plaque in its proximal portion with mild stenosis of 25-49%.  Intermediate Branch is diffusely diseased in its proximal portion with moderate stenosis of 50-69%.  LCX is a non-dominant artery that gives rise to one large OM1 branch. This artery is diffusely diseased in its proximal and mid portion with numerous mixed plaques with moderate stenosis of 50-69% .  Other findings:  Normal pulmonary vein drainage into the left atrium. Rt middle pulmonary vein noted - normal variant.  Normal left atrial appendage without a thrombus.  Normal size of the pulmonary artery.  IMPRESSION: 1. Coronary calcium score of 1307. This was 15 percentile for age and sex matched control.  2. Normal coronary origin with right dominance.  3. CAD-RADS 4 Severe stenosis. (> 50% left main), diffuse advanced disease with multiple at least moderate stenosis. Cardiac catheterization or CT FFR  is recommended. Consider symptom-guided anti-ischemic pharmacotherapy as well as risk factor modification per guideline directed care.  4. Plaque analysis: TPV 1826 mm3, 96th percentile, calcified plaque 387 mm3, non calcified plaque 1439 mm3. Extensive TPV.  Electronically Signed: By: Lamar Fitch M.D. On: 10/06/2023 17:46     ______________________________________________________________________________________________      Risk Assessment/Calculations           Physical Exam VS:  BP 124/84   Pulse 86   Ht 5' 7 (1.702 m)   Wt 216 lb (98 kg)   SpO2 96%   BMI 33.83 kg/m        Wt Readings from Last 3 Encounters:  10/13/23 216 lb (98 kg)  09/04/23 217 lb (98.4 kg)  07/26/19 220 lb (99.8 kg)    GEN: Well nourished, well developed in no acute distress NECK: No JVD; No carotid bruits CARDIAC: RRR, no murmurs, rubs, gallops RESPIRATORY:  Clear to auscultation without rales, wheezing or rhonchi  ABDOMEN: Soft, non-tender, non-distended EXTREMITIES:  No edema; No deformity   ASSESSMENT AND PLAN CAD - coronary CTA revealed calcium score 1307, 97th percentile, extensive TPV, FFR demonstrated high likelihood of hemodynamically significant stenosis in distal LM, mid RCA. He is having episodes of stable angina. We discussed proceeding with a cardiac catheterization, including risks versus benefits including 1 in 200 chance of major bleeding, 1 in 500 chance of vascular injury, transient kidney damage, arrhythmias, 1 in 1000 chance of MI, stroke, death, he is agreeable to proceed with  cardiac cath.  Currently on aspirin 325 mg daily but will change to 81 mg daily on the day of his procedure.  Will send in nitroglycerin  for him, explained how to use this and when to present to the emergency department. CBC, CMP today.   Hypertension-blood pressure is controlled today 124/84, continue Norvasc  5 mg daily, hydrochlorothiazide  12.5 mg daily, losartan 100 mg daily, metoprolol  25 mg  daily.  Dyslipidemia-will check direct LDL, LP(a) today, continue Lipitor 40 mg daily, will intensify regimen based on test results.  Prior tobacco abuse-cessation x 20 years.  Current alcohol use-he drinks 3-4 beers at least 4 days of the week, we discussed this in detail and his need to cut back, he plans to do so gradually.  History of stroke with PFO - on aspirin 325 mg daily and Lipitor.       Informed Consent   Shared Decision Making/Informed Consent The risks [stroke (1 in 1000), death (1 in 1000), kidney failure [usually temporary] (1 in 500), bleeding (1 in 200), allergic reaction [possibly serious] (1 in 200)], benefits (diagnostic support and management of coronary artery disease) and alternatives of a cardiac catheterization were discussed in detail with Mr. Kelter and he is willing to proceed.     Dispo: CBC, CMP, direct LDL, LP(a), left heart catheterization, follow up in 4 weeks.   Signed, Delon JAYSON Hoover, NP

## 2023-10-16 ENCOUNTER — Ambulatory Visit: Payer: Self-pay | Admitting: Cardiology

## 2023-10-16 DIAGNOSIS — Z79899 Other long term (current) drug therapy: Secondary | ICD-10-CM

## 2023-10-16 DIAGNOSIS — E782 Mixed hyperlipidemia: Secondary | ICD-10-CM

## 2023-10-16 DIAGNOSIS — I25118 Atherosclerotic heart disease of native coronary artery with other forms of angina pectoris: Secondary | ICD-10-CM

## 2023-10-16 LAB — CBC WITH DIFFERENTIAL/PLATELET
Basophils Absolute: 0.1 x10E3/uL (ref 0.0–0.2)
Basos: 1 %
EOS (ABSOLUTE): 0.6 x10E3/uL — ABNORMAL HIGH (ref 0.0–0.4)
Eos: 6 %
Hematocrit: 44.5 % (ref 37.5–51.0)
Hemoglobin: 14.8 g/dL (ref 13.0–17.7)
Immature Grans (Abs): 0 x10E3/uL (ref 0.0–0.1)
Immature Granulocytes: 0 %
Lymphocytes Absolute: 2.1 x10E3/uL (ref 0.7–3.1)
Lymphs: 23 %
MCH: 29.2 pg (ref 26.6–33.0)
MCHC: 33.3 g/dL (ref 31.5–35.7)
MCV: 88 fL (ref 79–97)
Monocytes Absolute: 0.7 x10E3/uL (ref 0.1–0.9)
Monocytes: 8 %
Neutrophils Absolute: 5.6 x10E3/uL (ref 1.4–7.0)
Neutrophils: 62 %
Platelets: 294 x10E3/uL (ref 150–450)
RBC: 5.07 x10E6/uL (ref 4.14–5.80)
RDW: 12.2 % (ref 11.6–15.4)
WBC: 9 x10E3/uL (ref 3.4–10.8)

## 2023-10-16 LAB — BASIC METABOLIC PANEL WITH GFR
BUN/Creatinine Ratio: 17 (ref 10–24)
BUN: 19 mg/dL (ref 8–27)
CO2: 22 mmol/L (ref 20–29)
Calcium: 9 mg/dL (ref 8.6–10.2)
Chloride: 102 mmol/L (ref 96–106)
Creatinine, Ser: 1.09 mg/dL (ref 0.76–1.27)
Glucose: 123 mg/dL — ABNORMAL HIGH (ref 70–99)
Potassium: 3.5 mmol/L (ref 3.5–5.2)
Sodium: 140 mmol/L (ref 134–144)
eGFR: 77 mL/min/1.73

## 2023-10-16 LAB — LIPOPROTEIN A (LPA): Lipoprotein (a): 48.1 nmol/L

## 2023-10-16 LAB — LDL CHOLESTEROL, DIRECT: LDL Direct: 84 mg/dL (ref 0–99)

## 2023-10-17 MED ORDER — ATORVASTATIN CALCIUM 80 MG PO TABS: 80.0000 mg | ORAL_TABLET | Freq: Every day | ORAL | 3 refills | Status: DC

## 2023-10-17 NOTE — Telephone Encounter (Signed)
-----   Message from Delon JAYSON Hoover sent at 10/16/2023 10:37 AM EDT ----- Labs are good for his upcoming cath.  His cholesterol is 84, his goal needs to be lower than this would like him to double his Lipitor to 80 mg.  LP(a) was normal, this means that genetics is not playing a huge role in the development of his coronary artery disease and it is likely lifestyle choices that have contributed, he needs to focus on  modifying these.  We already briefly touched on this when he was in the office the other day.  Double Lipitor to 80 mg daily, repeat FLP and LFTs in 6 weeks. ----- Message ----- From: Rebecka Memos Lab Results In Sent: 10/16/2023   5:36 AM EDT To: Delon JAYSON Hoover, NP

## 2023-10-17 NOTE — Telephone Encounter (Signed)
 Informed pt of lab results and sent in new Rx for Lipitor. Ordered new labs for 6 weeks from now.

## 2023-10-18 ENCOUNTER — Telehealth: Payer: Self-pay | Admitting: *Deleted

## 2023-10-18 NOTE — Telephone Encounter (Signed)
 Cardiac Catheterization scheduled at [facility] for: October 19, 2023 10:30 AM Arrival time [facility] Main Entrance A at: 8:30 AM  Diet: -Nothing to eat after midnight.  Hydration: -May drink clear liquids until 2 hours before the procedure.  Approved liquids: Water , clear tea, black coffee, fruit juices-non-citric and without pulp,Gatorade, plain Jello/popsicles.   -Please drink 16 oz of water  2 hours before procedure.  Medication instructions: -Hold:  Farxiga-PM prior to procedure  Hydrochlorothiazide /KCl-AM of procedure  -Other usual morning medications can be taken including aspirin  81 mg.  Plan to go home the same day, you will only stay overnight if medically necessary.  You must have responsible adult to drive you home.  Someone must be with you the first 24 hours after you arrive home.  Reviewed procedure instructions with patient.

## 2023-10-19 ENCOUNTER — Encounter (HOSPITAL_COMMUNITY)
Admission: RE | Disposition: A | Discharge status: Home / Self Care | Payer: Self-pay | Source: Home / Self Care | Attending: Cardiovascular Disease

## 2023-10-19 ENCOUNTER — Ambulatory Visit (HOSPITAL_COMMUNITY)
Admission: RE | Admit: 2023-10-19 | Discharge: 2023-10-19 | Disposition: A | Discharge status: Home / Self Care | Attending: Cardiovascular Disease | Admitting: Cardiovascular Disease

## 2023-10-19 ENCOUNTER — Other Ambulatory Visit: Payer: Self-pay

## 2023-10-19 DIAGNOSIS — I10 Essential (primary) hypertension: Secondary | ICD-10-CM | POA: Diagnosis not present

## 2023-10-19 DIAGNOSIS — Z8774 Personal history of (corrected) congenital malformations of heart and circulatory system: Secondary | ICD-10-CM | POA: Insufficient documentation

## 2023-10-19 DIAGNOSIS — Z01812 Encounter for preprocedural laboratory examination: Secondary | ICD-10-CM

## 2023-10-19 DIAGNOSIS — I2584 Coronary atherosclerosis due to calcified coronary lesion: Secondary | ICD-10-CM | POA: Diagnosis not present

## 2023-10-19 DIAGNOSIS — Z7982 Long term (current) use of aspirin: Secondary | ICD-10-CM | POA: Insufficient documentation

## 2023-10-19 DIAGNOSIS — I25118 Atherosclerotic heart disease of native coronary artery with other forms of angina pectoris: Secondary | ICD-10-CM | POA: Diagnosis not present

## 2023-10-19 DIAGNOSIS — Z8673 Personal history of transient ischemic attack (TIA), and cerebral infarction without residual deficits: Secondary | ICD-10-CM | POA: Insufficient documentation

## 2023-10-19 DIAGNOSIS — Z79899 Other long term (current) drug therapy: Secondary | ICD-10-CM | POA: Insufficient documentation

## 2023-10-19 DIAGNOSIS — Z87891 Personal history of nicotine dependence: Secondary | ICD-10-CM | POA: Insufficient documentation

## 2023-10-19 DIAGNOSIS — E785 Hyperlipidemia, unspecified: Secondary | ICD-10-CM | POA: Insufficient documentation

## 2023-10-19 DIAGNOSIS — I2510 Atherosclerotic heart disease of native coronary artery without angina pectoris: Secondary | ICD-10-CM

## 2023-10-19 DIAGNOSIS — Z789 Other specified health status: Secondary | ICD-10-CM

## 2023-10-19 DIAGNOSIS — Q2112 Patent foramen ovale: Secondary | ICD-10-CM

## 2023-10-19 DIAGNOSIS — E782 Mixed hyperlipidemia: Secondary | ICD-10-CM

## 2023-10-19 HISTORY — PX: LEFT HEART CATH AND CORONARY ANGIOGRAPHY: CATH118249

## 2023-10-19 SURGERY — LEFT HEART CATH AND CORONARY ANGIOGRAPHY
Anesthesia: LOCAL

## 2023-10-19 MED ORDER — FREE WATER: 500.0000 mL | Freq: Once | Status: DC

## 2023-10-19 MED ORDER — HEPARIN SODIUM (PORCINE) 1000 UNIT/ML IJ SOLN
INTRAMUSCULAR | Status: DC | PRN
  Administered 2023-10-19: 5000 [IU] via INTRAVENOUS

## 2023-10-19 MED ORDER — SODIUM CHLORIDE 0.9% FLUSH: 3.0000 mL | INTRAVENOUS | Status: DC | PRN

## 2023-10-19 MED ORDER — ACETAMINOPHEN 325 MG PO TABS: 650.0000 mg | ORAL_TABLET | ORAL | Status: DC | PRN

## 2023-10-19 MED ORDER — HEPARIN SODIUM (PORCINE) 1000 UNIT/ML IJ SOLN
INTRAMUSCULAR | Status: AC
  Filled 2023-10-19: qty 10

## 2023-10-19 MED ORDER — VERAPAMIL HCL 2.5 MG/ML IV SOLN
INTRAVENOUS | Status: DC | PRN
  Administered 2023-10-19: 10 mL via INTRA_ARTERIAL

## 2023-10-19 MED ORDER — SODIUM CHLORIDE 0.9% FLUSH: 3.0000 mL | Freq: Two times a day (BID) | INTRAVENOUS | Status: DC

## 2023-10-19 MED ORDER — HEPARIN (PORCINE) IN NACL 1000-0.9 UT/500ML-% IV SOLN
INTRAVENOUS | Status: DC | PRN
  Administered 2023-10-19 (×2): 500 mL via INTRA_ARTERIAL

## 2023-10-19 MED ORDER — MIDAZOLAM HCL 2 MG/2ML IJ SOLN
INTRAMUSCULAR | Status: DC | PRN
Start: 2023-10-19 — End: 2023-10-19
  Administered 2023-10-19: 2 mg via INTRAVENOUS

## 2023-10-19 MED ORDER — VERAPAMIL HCL 2.5 MG/ML IV SOLN
INTRAVENOUS | Status: AC
  Filled 2023-10-19: qty 2

## 2023-10-19 MED ORDER — SODIUM CHLORIDE 0.9 % IV SOLN: 250.0000 mL | INTRAVENOUS | Status: DC | PRN

## 2023-10-19 MED ORDER — IOHEXOL 350 MG/ML SOLN
INTRAVENOUS | Status: DC | PRN
  Administered 2023-10-19: 52 mL via INTRA_ARTERIAL

## 2023-10-19 MED ORDER — ASPIRIN 81 MG PO CHEW: 81.0000 mg | CHEWABLE_TABLET | ORAL | Status: DC

## 2023-10-19 MED ORDER — LIDOCAINE HCL (PF) 1 % IJ SOLN
INTRAMUSCULAR | Status: AC
  Filled 2023-10-19: qty 30

## 2023-10-19 MED ORDER — HYDRALAZINE HCL 20 MG/ML IJ SOLN: 10.0000 mg | INTRAMUSCULAR | Status: DC | PRN

## 2023-10-19 MED ORDER — LABETALOL HCL 5 MG/ML IV SOLN: 10.0000 mg | INTRAVENOUS | Status: DC | PRN

## 2023-10-19 MED ORDER — FENTANYL CITRATE (PF) 100 MCG/2ML IJ SOLN
INTRAMUSCULAR | Status: AC
  Filled 2023-10-19: qty 2

## 2023-10-19 MED ORDER — FENTANYL CITRATE (PF) 100 MCG/2ML IJ SOLN
INTRAMUSCULAR | Status: DC | PRN
  Administered 2023-10-19: 50 ug via INTRAVENOUS

## 2023-10-19 MED ORDER — ONDANSETRON HCL 4 MG/2ML IJ SOLN
4.0000 mg | Freq: Four times a day (QID) | INTRAMUSCULAR | Status: DC | PRN
Start: 2023-10-19 — End: 2023-10-19

## 2023-10-19 MED ORDER — SODIUM CHLORIDE 0.9 % IV SOLN
250.0000 mL | INTRAVENOUS | Status: DC | PRN
Start: 2023-10-19 — End: 2023-10-19

## 2023-10-19 MED ORDER — LIDOCAINE HCL (PF) 1 % IJ SOLN
INTRAMUSCULAR | Status: DC | PRN
  Administered 2023-10-19: 2 mL

## 2023-10-19 MED ORDER — MIDAZOLAM HCL 2 MG/2ML IJ SOLN
INTRAMUSCULAR | Status: AC
  Filled 2023-10-19: qty 2

## 2023-10-19 SURGICAL SUPPLY — 7 items
CATH INFINITI 5 FR JL3.5 (CATHETERS) IMPLANT
CATH INFINITI JR4 5F (CATHETERS) IMPLANT
DEVICE RAD COMP TR BAND LRG (VASCULAR PRODUCTS) IMPLANT
GLIDESHEATH SLEND SS 6F .021 (SHEATH) IMPLANT
GUIDEWIRE INQWIRE 1.5J.035X260 (WIRE) IMPLANT
PACK CARDIAC CATHETERIZATION (CUSTOM PROCEDURE TRAY) ×1 IMPLANT
SET ATX-X65L (MISCELLANEOUS) IMPLANT

## 2023-10-19 NOTE — Interval H&P Note (Signed)
 History and Physical Interval Note:  10/19/2023 9:20 AM  Chris Lynch Chris Lynch  has presented today for surgery, with the diagnosis of Abnormal Cardiac CT.  The various methods of treatment have been discussed with the patient and family. After consideration of risks, benefits and other options for treatment, the patient has consented to  Procedure(s): LEFT HEART CATH AND CORONARY ANGIOGRAPHY (N/A) as a surgical intervention.  The patient's history has been reviewed, patient examined, no change in status, stable for surgery.  I have reviewed the patient's chart and labs.  Questions were answered to the patient's satisfaction.    Cath Lab Visit (complete for each Cath Lab visit)  Clinical Evaluation Leading to the Procedure:   ACS: No.  Non-ACS:    Anginal Classification: CCS III  Anti-ischemic medical therapy: Maximal Therapy (2 or more classes of medications)  Non-Invasive Test Results: High-risk stress test findings: cardiac mortality >3%/year  Prior CABG: No previous CABG        Chris Lynch

## 2023-10-20 ENCOUNTER — Encounter (HOSPITAL_COMMUNITY): Payer: Self-pay | Admitting: Cardiovascular Disease

## 2023-11-06 ENCOUNTER — Other Ambulatory Visit: Payer: Self-pay

## 2023-11-06 DIAGNOSIS — Z8619 Personal history of other infectious and parasitic diseases: Secondary | ICD-10-CM | POA: Insufficient documentation

## 2023-11-07 ENCOUNTER — Ambulatory Visit: Payer: PRIVATE HEALTH INSURANCE

## 2023-11-07 VITALS — BP 126/84 | HR 70 | Ht 67.0 in | Wt 212.8 lb

## 2023-11-07 DIAGNOSIS — I2510 Atherosclerotic heart disease of native coronary artery without angina pectoris: Secondary | ICD-10-CM | POA: Insufficient documentation

## 2023-11-07 DIAGNOSIS — I10 Essential (primary) hypertension: Secondary | ICD-10-CM | POA: Insufficient documentation

## 2023-11-07 DIAGNOSIS — E782 Mixed hyperlipidemia: Secondary | ICD-10-CM | POA: Diagnosis not present

## 2023-11-07 DIAGNOSIS — I63511 Cerebral infarction due to unspecified occlusion or stenosis of right middle cerebral artery: Secondary | ICD-10-CM | POA: Insufficient documentation

## 2023-11-07 MED ORDER — ATORVASTATIN CALCIUM 80 MG PO TABS: 80.0000 mg | ORAL_TABLET | Freq: Every day | ORAL | 3 refills | Status: AC

## 2023-11-07 NOTE — Addendum Note (Signed)
 Addended by: Draken Farrior, ANNABELLA L on: 11/07/2023 02:52 PM   Modules accepted: Orders

## 2023-11-07 NOTE — Assessment & Plan Note (Signed)
 With abnormal labs and BiPAP chest pain reported on effort evaluated with cardiac CT. Cardiac CT coronary angiogram completed 10/05/2023 noted severe stenosis CAD RADS 4 disease with elevated calcium  score 1307 and total plaque volume 1826 mm cube.  No significant extracardiac findings.  Hemodynamically significant CT FFR in left main and mid RCA.  Cardiac cath completed 10/19/2023 noted moderate left main stenosis and moderate three-vessel stenosis, LVEDP 15 mmHg, recommended medical therapy.  Continue aggressive dietary lifestyle modifications and pharmacotherapy for secondary prevention.   Continue aspirin  81 mg once daily from cardiac standpoint.  Currently on 325 mg given his history of stroke.  Will defer to PCP and neurologist with regards to dose of aspirin .  Continue atorvastatin , titrate up the dose to 80 mg once daily.

## 2023-11-07 NOTE — Patient Instructions (Signed)
 Medication Instructions:  Increase your Atorvastatin  80 mg by mouth daily  *If you need a refill on your cardiac medications before your next appointment, please call your pharmacy*  Lab Work: None If you have labs (blood work) drawn today and your tests are completely normal, you will receive your results only by: MyChart Message (if you have MyChart) OR A paper copy in the mail If you have any lab test that is abnormal or we need to change your treatment, we will call you to review the results.  Testing/Procedures: None  Follow-Up: At [facility], you and your health needs are our priority.  As part of our continuing mission to provide you with exceptional heart care, our providers are all part of one team.  This team includes your primary Cardiologist (physician) and Advanced Practice Providers or APPs (Physician Assistants and Nurse Practitioners) who all work together to provide you with the care you need, when you need it.  Your next appointment:   9 month(s)  Provider:   Alean Kobus, MD    We recommend signing up for the patient portal called MyChart.  Sign up information is provided on this After Visit Summary.  MyChart is used to connect with patients for Virtual Visits (Telemedicine).  Patients are able to view lab/test results, encounter notes, upcoming appointments, etc.  Non-urgent messages can be sent to your provider as well.   To learn more about what you can do with MyChart, go to [email or web address].   Other Instructions

## 2023-11-07 NOTE — Assessment & Plan Note (Addendum)
 Has follow-up with neurologist.  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  Event monitor for assessing for any atrial arrhythmias came on stop within a day. He did not want to repeat it and wants to proceed with loop recorder.  Discussed the eyes, procedure and cost associated with it. He is agreeable. Has a visit coming up with Dr. Inocencio on October 23.

## 2023-11-07 NOTE — Progress Notes (Signed)
 Cardiology Consultation:    Date:  11/07/2023   ID:  [name] Louder, DOB [birth year], MRN [id number]  PCP:  Nestor Elston NOVAK, NP  Cardiologist:  Alean JONELLE Kobus, MD   Referring MD: Nestor Elston NOVAK, NP   No chief complaint on file.    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 Transthoracic echocardiogram 12/12/2022 at [facility] reported normal LVEF 60 to 65%, normal RV size and function, agitated saline study with right-to-left atrial level shunt reported.  Here for follow-up visit. Doing well.  Problem List Items Addressed This Visit     Essential hypertension   Well-controlled. Target blood pressure below 130/80 mmHg. Continue amlodipine  currently on 5 mg twice daily. Continue hydrochlorothiazide  25 mg once daily. Continue losartan 100 mg once daily Continue metoprolol  XL 25 mg once daily.  From cardiac standpoint he was wondering if he is okay to take Cialis or Viagra. No contraindications from cardiac standpoint and okay to use Cialis or Viagra as needed for erectile dysfunction.      Relevant Medications   atorvastatin  (LIPITOR) 40 MG tablet   Hyperlipidemia   LDL 84 and lipoprotein a 48.1 on 10/13/2023. Titrate up atorvastatin  dose to 80 mg once daily.       Relevant Medications    atorvastatin  (LIPITOR) 40 MG tablet   CVA (cerebral vascular accident) [facility])   Has follow-up with neurologist.  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  Event monitor for assessing for any atrial arrhythmias came on stop within a day. He did not want to repeat it and wants to proceed with loop recorder.  Discussed the eyes, procedure and cost associated with it. He is agreeable. Has a visit coming up with Dr. Inocencio on October 23.       Relevant Medications   atorvastatin  (LIPITOR) 40 MG tablet   CAD (coronary artery disease) - Primary   With abnormal labs and BiPAP chest pain reported on effort evaluated with cardiac CT. Cardiac CT coronary angiogram completed 10/05/2023 noted severe stenosis CAD RADS 4 disease with elevated calcium  score 1307 and total plaque volume 1826 mm cube.  No significant extracardiac findings.  Hemodynamically significant CT FFR in left main and mid RCA.  Cardiac cath completed 10/19/2023 noted moderate left main stenosis and moderate three-vessel stenosis, LVEDP 15 mmHg, recommended medical therapy.  Continue aggressive dietary lifestyle modifications and pharmacotherapy for secondary prevention.   Continue aspirin  81 mg once daily from cardiac standpoint.  Currently on 325 mg given his history of stroke.  Will defer to PCP and neurologist with regards to dose of aspirin .  Continue atorvastatin , titrate up  the dose to 80 mg once daily.         Relevant Medications   atorvastatin  (LIPITOR) 40 MG tablet    Return to clinic tentatively in 9 months  History of Present Illness:    Chris Lynch is a 63 y.o. male who is being seen today for follow-up  visit. PCP is Tetter, Elston NOVAK, NP. Last visit with me in the office was 09/04/2023. . 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 Transthoracic echocardiogram 12/12/2022 at [facility] reported normal LVEF 60 to 65%, normal RV size and function, agitated saline study with right-to-left atrial level shunt reported.  Cardiac CT coronary angiogram completed 10/05/2023 noted severe stenosis CAD RADS 4 disease with elevated calcium  score 1307 and total plaque volume 1826 mm cube.  No significant extracardiac findings.  Hemodynamically significant CT FFR in left main and mid RCA.  Cardiac cath completed 10/19/2023 noted moderate left main stenosis and moderate three-vessel stenosis, LVEDP 15 mmHg, recommended medical therapy.  Lipoprotein a done noted 48.1. LDL cholesterol 84.  On 10/13/2023  Event monitor ordered at last visit fell off after day 1 and he did not want to continue with that and preferred to pursue loop recorder if indicated. Pending visit with electrophysiologist Dr. Inocencio on October 23 for this evaluation.  Here for the visit today mentions he has been doing well.  No active cardiac symptoms. Taking his medications consistently.  Past Medical History:  Diagnosis Date   Abnormal blood chemistry level 03/21/2023   Abnormal finding of blood chemistry, unspecifiedAbnormal finding of blood chemistry, unspecified; Start Date : 01/13/2025Abnormal  finding of blood chemistry, unspecified; Start Date : 02/09/2023     Acute abdomen 03/02/2023   Acute abdomen     AKI (acute kidney injury)    Body mass index (BMI) 33.0-33.9, adult 01/16/2023   Body mass index [BMI] 33.0-33.9, adult     Chest tightness 09/04/2023   Chronic serous otitis media of both ears 06/22/2020   CVA (cerebral vascular accident) (HCC) 09/04/2023   Dental caries 02/20/2023   Dental caries, unspecified     Deviated septum 06/16/2020   Essential hypertension 01/09/2019   Generalized abdominal pain 03/06/2023   Generalized abdominal pain     GI problem 11/14/2020   Hepatitis C virus infection cured after interferon therapy    History of basal cell carcinoma (BCC) 08/24/2023   History of cerebrovascular disease 03/06/2023   Prsnl hx of TIA (TIA), and cereb infrc w/o resid deficitsPrsnl hx of TIA (TIA), and cereb infrc w/o resid deficits; Start Date : 01/06/2025Prsnl hx of TIA (TIA), and cereb infrc w/o resid deficits; Start Date : 12/19/2024Prsnl hx of TIA (TIA), and cereb infrc w/o resid deficits; Start Date : 01/02/2023     History of hepatitis C 06/16/2020   Last Assessment & Plan:      Getting HCV RNA today.     History of melanoma 04/08/2020   Skin Cancer History-Melanoma     Diagnosis [place]reslow depth Sentinel  Node Location Biopsy Date Treatment date Treatment type Surgeon     Melanoma in situ [place] 1  Mid upper back 11/2019 12/2019 [facility] [place]     Hyperlipidemia 12/10/2022   Hyperlipidemia, unspecifiedHyperlipidemia, unspecified; Start Date : 01/02/2023     Hypokalemia    Imaging of gastrointestinal tract abnormal 03/06/2023   Abnormal findings on dx imaging of prt digestive tract     Medication monitoring encounter 01/28/2019   Moderate alcohol consumption 06/16/2020   Mononeuropathy 11/14/2020   Nasal obstruction 06/23/2020   Added automatically from request for surgery [id number]     Numbness and tingling in left arm 12/12/2022   Nutritional anemia  02/02/2023   Vitamin B12 deficiency anemia, unspecifiedVitamin B12 deficiency anemia, unspecified; Start Date : 01/02/2023     Patent foramen ovale 01/04/2023   Personal history of other diseases of urinary system 07/19/2023   Polyneuropathy 07/19/2023   Prediabetes 01/02/2023   Prediabetes     Septic prepatellar bursitis of left knee    Sequelae of cerebral infarction 07/19/2023   Stroke (HCC) 11/2022   [facility]   Stroke, lacunar (HCC) 12/12/2022    Past Surgical History:  Procedure Laterality Date   IRRIGATION AND DEBRIDEMENT KNEE Left 01/08/2019   Procedure: IRRIGATION AND DEBRIDEMENT OF PREPATELLAR LEFT KNEE;  Surgeon: Jerri Kay HERO, MD;  Location: [facility] OR;  Service: Orthopedics;  Laterality: Left;   LEFT HEART CATH AND CORONARY ANGIOGRAPHY N/A 10/19/2023   Procedure: LEFT HEART CATH AND CORONARY ANGIOGRAPHY;  Surgeon: Verlin Lonni BIRCH, MD;  Location: [facility] INVASIVE CV LAB;  Service: Cardiovascular;  Laterality: N/A;    Current Medications: Current Meds  Medication Sig   amLODipine  (NORVASC ) 5 MG tablet Take 5 mg by mouth 2 (two) times daily.   aspirin  325 MG tablet Take 325 mg by mouth daily.   atorvastatin  (LIPITOR) 40 MG tablet Take 40 mg by mouth daily.   FARXIGA 10 MG TABS tablet Take 10 mg by mouth every evening.   fluticasone (FLONASE) 50 MCG/ACT nasal spray Place 2 sprays into both nostrils daily as needed for allergies or rhinitis.   hydrochlorothiazide  (MICROZIDE ) 12.5 MG capsule Take 12.5 mg by mouth every morning.   losartan (COZAAR) 100 MG tablet Take 100 mg by mouth daily.   metoprolol  succinate (TOPROL -XL) 25 MG 24 hr tablet Take 25 mg by mouth every morning.   Multiple Vitamin (MULTIVITAMIN WITH MINERALS) TABS tablet Take 1 tablet by mouth daily.   nitroGLYCERIN  (NITROSTAT ) 0.4 MG SL tablet Place 1 tablet (0.4 mg total) under the tongue every 5 (five) minutes as needed.   omeprazole (PRILOSEC) 20 MG capsule Take 20 mg by mouth in the morning.   Potassium  Chloride ER 20 MEQ TBCR Take 20 mEq by mouth daily.   pregabalin (LYRICA) 100 MG capsule Take 100 mg by mouth 2 (two) times daily.   vitamin B-12 (CYANOCOBALAMIN) 500 MCG tablet Take 500 mcg by mouth daily.   [DISCONTINUED] atorvastatin  (LIPITOR) 80 MG tablet Take 1 tablet (80 mg total) by mouth daily.     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 : Never true  Transportation Needs: No Transportation Needs (12/11/2022)   Received from [facility]    In the past 12 months, has lack of reliable transportation kept you from medical appointments, meetings, work or from getting things needed for daily living? : No  Physical Activity: Not on file  Stress: Not on file  Social Connections: Not on file     Family History: The patient's family history includes Brain cancer in his mother and sister; Heart attack in his father; Stroke in his father; Throat cancer in his brother. ROS:   Please see the history of present illness.    All 14  point review of systems negative except as described per history of present illness.  EKGs/Labs/Other Studies Reviewed:    The following studies were reviewed today:   EKG:       Recent Labs: 10/13/2023: BUN 19; Creatinine, Ser 1.09; Hemoglobin 14.8; Platelets 294; Potassium 3.5; Sodium 140  Recent Lipid Panel    Component Value Date/Time   LDLDIRECT 84 10/13/2023 1444    Physical Exam:    VS:  BP 126/84   Pulse 70   Ht 5' 7 (1.702 m)   Wt 212 lb 12.8 oz (96.5 kg)   SpO2 94%   BMI 33.33 kg/m     Wt Readings from Last 3 Encounters:  11/07/23 212 lb 12.8 oz (96.5 kg)  10/19/23 (P) 220 lb (99.8 kg)  10/13/23 216 lb (98 kg)     GENERAL:  Well nourished, well developed in no acute distress NECK: No JVD; No carotid bruits CARDIAC: RRR, S1 and S2 present, no murmurs, no rubs, no gallops CHEST:  Clear to auscultation without rales, wheezing or rhonchi  Extremities: No pitting pedal edema. Pulses bilaterally symmetric with radial 2+ and dorsalis pedis 2+ NEUROLOGIC:  Alert and oriented x 3  Medication Adjustments/Labs and Tests Ordered: Current medicines are reviewed at length with the patient today.  Concerns regarding medicines are outlined above.  No orders of the defined types were placed in this encounter.  No orders of the defined types were placed in this encounter.   Signed, Alean jess Kobus, MD, MPH, [facility]. 11/07/2023 2:48 PM    [facility]

## 2023-11-07 NOTE — Assessment & Plan Note (Signed)
 LDL 84 and lipoprotein a 48.1 on 10/13/2023. Titrate up atorvastatin  dose to 80 mg once daily.

## 2023-11-07 NOTE — Assessment & Plan Note (Signed)
 Well-controlled. Target blood pressure below 130/80 mmHg. Continue amlodipine  currently on 5 mg twice daily. Continue hydrochlorothiazide  25 mg once daily. Continue losartan 100 mg once daily Continue metoprolol  XL 25 mg once daily.  From cardiac standpoint he was wondering if he is okay to take Cialis or Viagra. No contraindications from cardiac standpoint and okay to use Cialis or Viagra as needed for erectile dysfunction.

## 2023-11-24 ENCOUNTER — Telehealth: Payer: Self-pay

## 2023-11-24 NOTE — Telephone Encounter (Signed)
 Patient had full panel done at PCP and is wondering if he still needs to come here for blood work next week? CB # [phone]

## 2023-11-24 NOTE — Telephone Encounter (Signed)
 Pt states that he just came back to the office and had labs done.

## 2023-11-25 LAB — LIPID PANEL
Chol/HDL Ratio: 3.3 ratio (ref 0.0–5.0)
Cholesterol, Total: 144 mg/dL (ref 100–199)
HDL: 43 mg/dL
LDL Chol Calc (NIH): 78 mg/dL (ref 0–99)
Triglycerides: 128 mg/dL (ref 0–149)
VLDL Cholesterol Cal: 23 mg/dL (ref 5–40)

## 2023-11-25 LAB — HEPATIC FUNCTION PANEL
ALT: 17 IU/L (ref 0–44)
AST: 15 IU/L (ref 0–40)
Albumin: 4.3 g/dL (ref 3.9–4.9)
Alkaline Phosphatase: 81 IU/L (ref 47–123)
Bilirubin Total: 0.6 mg/dL (ref 0.0–1.2)
Bilirubin, Direct: 0.2 mg/dL (ref 0.00–0.40)
Total Protein: 7.1 g/dL (ref 6.0–8.5)

## 2023-11-27 ENCOUNTER — Other Ambulatory Visit: Payer: Self-pay

## 2023-12-07 ENCOUNTER — Ambulatory Visit: Payer: PRIVATE HEALTH INSURANCE | Admitting: Cardiology

## 2024-02-26 NOTE — Telephone Encounter (Signed)
 Refilled

## 2024-02-27 NOTE — Progress Notes (Unsigned)
" °  Electrophysiology Office Note:   Date:  02/27/2024  ID:  [name] Louder, DOB 1961/01/26, MRN [id number]  Primary Cardiologist: Alean SAUNDERS Madireddy, MD Primary Heart Failure: None Electrophysiologist: None  {Click to update primary MD,subspecialty MD or APP then REFRESH:1}    History of Present Illness:   Chris Lynch is a 64 y.o. male with h/o nonobstructive coronary artery disease, CVA, PFO, hyperlipidemia, hypertension, prediabetes, treated hepatitis C, former tobacco seen today for  for Electrophysiology evaluation of CVA at the request of Sreedhar Madireddy.    He had a thalamic stroke October 2024.  Brain MRI showed a 6 mm acute infarct in the lateral thalamus.  He has had residual left-sided upper and lower extremity numbness.  He was found to have a PFO but was thought not a candidate for closure.  He had a left heart catheterization 10/19/2023 that showed moderate left main stenosis and moderate three-vessel coronary artery disease.  Discussed the use of AI scribe software for clinical note transcription with the patient, who gave verbal consent to proceed.  History of Present Illness     Review of systems complete and found to be negative unless listed in HPI.   EP Information / Studies Reviewed:    {EKGtoday:28818}        Risk Assessment/Calculations:           Physical Exam:   VS:  There were no vitals taken for this visit.   Wt Readings from Last 3 Encounters:  11/07/23 212 lb 12.8 oz (96.5 kg)  10/19/23 (P) 220 lb (99.8 kg)  10/13/23 216 lb (98 kg)     GEN: Well nourished, well developed in no acute distress NECK: No JVD; No carotid bruits CARDIAC: {EPRHYTHM:28826}, no murmurs, rubs, gallops RESPIRATORY:  Clear to auscultation without rales, wheezing or rhonchi  ABDOMEN: Soft, non-tender, non-distended EXTREMITIES:  No edema; No deformity   ASSESSMENT AND PLAN:    1.  Cryptogenic stroke: Had a thalamic stroke [facility]  He does have a  PFO, but thought not closure candidate.  He would thus benefit from monitoring for atrial fibrillation.  We discussed 2-week monitor versus ILR implant.  He would prefer ILR implant.  Risks and benefits again discussed.  This includes bleeding infection.  Patient understands the risks and has agreed to the procedure.***  2.  Coronary artery disease: No current chest pain.  Plan per primary cardiology.  Follow up with {EPMDS:28135::EP Team} {EPFOLLOW LE:71826}  Signed, Sylvie Mifsud Gladis Norton, MD   {LOOPDIAGNOSIS:29630} "

## 2024-02-28 ENCOUNTER — Encounter: Payer: Self-pay | Admitting: Cardiology

## 2024-02-28 ENCOUNTER — Ambulatory Visit: Attending: Cardiology | Admitting: Cardiology

## 2024-02-28 VITALS — BP 134/86 | HR 72 | Ht 67.0 in | Wt 214.0 lb

## 2024-02-28 DIAGNOSIS — I639 Cerebral infarction, unspecified: Secondary | ICD-10-CM | POA: Diagnosis not present

## 2024-02-28 NOTE — Progress Notes (Signed)
 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  Reports he was tired after last session not painful.  Feels the PT has helped his back pain.  It loosens me up Reports he is having a heart monitor placed (internally) today that he will wear for 3 years. I have some heart problems.  Pain:   0-2/10 currently   OBJECTIVE  General Observation/Objective Measures: Camellia Rosalynn Louder  ambulates in independently in no acute distress.    Interventions:  Therapeutic exercises: x 45 minutes for facilitating improvement in strength and ROM to decrease pain with guidance on positioning, posture, speed of motion, and control of motion with cues for all exercises on proper exercise technique and tips to stay within pain limits and for proper intensity with guidance on positioning, posture, speed of motion, and control of motion. Constant monitoring for safe progression of exercise/activity   - Sci Fit x 6 minutes for warm up while gathering subjective information   - Seated lumbar flexion stretch with large physioball 10 sec x 10 forward and lateral L/R x 10 each - Seated LAQ with DF at top x 10 bil - Seated piriformis stretch 30 sec x 3 , visibly tight bil  -Supine Lower Trunk Rotation  -x 10 bil with 5 sec hold more tightness with going to L today - Supine dynamic HS stretch with DF at top x 10 with strap assist - supine DKTC with feet on ball x 10 reps - Supine PPT 5 sec x 10 - supine abdominal bracing with mini  marches x 10 - Supine abdominal bracing with bent knee fall out x 10 bil (not today) - Prone lying to prone on elbows position to prone on elbow press up x 10 - Standing Lumbar Spine Flexion at // bars 15 sec x 5 (not today) - Standing trunk extension standing at // bars 15 sec x 5 (not today)    Modalities Skin Integrity Assessment:   N/A   Education: Yes, as described in interventions   educated in purpose and technique of all exercises and tasks performed today. Adjusted sets and reps of therapeutic exercise in order to maximize strength , endurance and functional movement patterns while minimizing pain. Tactile cues with all activities in order to assure proper muscle recruitment and prevent substitution of movement.      ASSESSMENT  Khaliq Turay did well with today's session.  Treatment focused on lumbar and LE stretches today with re -instruction in core stabilization exercises and he demonstrated better form today.  Introduced prone to POE and he felt good with this, no pain just stretch.  When he sat up he said he felt pretty good, no pain reported above his baseline.    He will continue to benefit from skilled PT services for appropriate progression of functional ex/activities, decrease pain, improve strength and ROM,  address functional goals and to improve quality of life and complete current plan of care.    Therapy Diagnosis:     ICD-10-CM   1.  Lumbar radiculopathy, right  M54.16   2. Posture abnormality  R29.3   3. Decreased strength of trunk and back  R29.898   4. Impaired functional mobility, balance, gait, and endurance  Z74.09     Progress Towards Goals:   progressing  Goals Addressed             This Visit's Progress    PT Goal       STG - In 6 visits The patient will demonstrate and report at least 90% compliance with initial home exercise program to facilitate carryover between therapeutic sessions and progress towards independent management.   12/20/2023 Issued home exercise program at [facility]   The patient will demonstrate improved functional lower extremity strength with 5x sit to stand in <12 seconds with no upper extremity assist and no increase in pain  12/20/2023 5x Sit to Stand: 15.11 seconds- min bilateral thigh assist - some limited trunk extension on the first 2 reps and then patient is able to transition fully to stand Greater than or equal to 12 seconds indicates fall risk  LTG - In 12 visits The patient will ambulate at least 450 feet in 2 minutes with no increase in low back pain to demonstrate improved tolerance to prolonged weight bearing for daily activities 12/20/2023 2 min walk test ( ): 414 feet  MDC = 12.2 meters (40') Norms: Men 20-79yrs: 764ft                Women 20-46yrs: 637ft Men 30-59yrs: 654ft                Women 30-62yrs: 537ft Men 40-55yrs: 680ft                Women 40-30yrs: 543ft Men 50-34yrs: 644ft                Women 50-94yrs: 520ft Men 60-32yrs: 674ft               Women 60-11yrs: 511ft Men 70-41yrs: 560ft                Women 70-95yrs: 488ft   The patient will demonstrate good back sparing techniques with squatting and lifting 10lbs from floor to chest with good squat mechanics and no provocation of low back pain  12/20/2023 Reports pain with heavy lifting for work tasks  The patient will report <6/10 low back pain at worse for 1 week with normal work tasks 12/20/2023 Reports pain 3/10 at start of session and reports pain at worse 10/10  The patient will improve Oswestry to <10% disability to reduce functional disability and improve tolerance to functional tasks.  12/20/2023 Oswestry Total: 11/45 Oswestry Score Calculated: 24.44%     PT Goal-back pain by 05/13/24       1.By 2 wks , the patient will demonstrate and report at least 90% compliance with initial home exercise program to facilitate carryover between therapeutic sessions and progress towards independent management.    Baseline:  HEP provided  02/14/2024   2.By 8-10 wks, pt will improve painfree lumbar active range of motion to at least 75 % of normal in all planes to allow them to tolerate spinal bending, hip hinging while lifting, and to optimize body mechanics with squatting for functional task.   Baseline:   Lumbar AROM Lumbar Spine  Flexion  WNL   Extension  50% p!   Right Lateral Flexion  WNL   Left Lateral Flexion  WNL   Right Rotation WNL  Left Rotation WNL      3.By 6wks,  pt's 2 min walk test distance will improve from 390 feet on IE to 420 feet , demonstrating statistically significant improvement (MDC = 40 feet) per this measure, representing progress in gait speed and activity endurance.  Baseline:  390 ft   [place]:   Men 20-6yrs: 772ft                Women 20-60yrs: 690ft Men 30-76yrs: 653ft                Women 30-20yrs: 538ft Men 40-63yrs: 665ft                Women 40-64yrs: 584ft Men 50-58yrs: 658ft                Women 50-68yrs: 566ft Men 60-80yrs: 651ft                Women 60-8yrs: 534ft Men 70-52yrs: 555ft                Women 70-36yrs: 47ft    4.By 10 wks, pt's Modified Oswestry score will improve from 26% on IE to </= 14%, demonstrating a statistically significant reduction (MCID = 12% or 6 points) in impairment due to LBP per this outcome measure.   0-20% = minimal disability 21-40% = moderate disability 41-60% = severe disability 61-80% = crippled 81-100% = bed bound   Baseline: 26%          PLAN Treatment Frequency and Duration:  PT Frequency: 1x/week; 2x/week PT Duration: -- (12 visits) Treatment Plan Details: 12 visits  Recommended PT Treatment/Interventions: Aquatic therapy (02886); Dry needling (1-2 muscles) (79439); Dry Needling (3+ muscles) (79438); Gait training (02883); Neuromuscular re-education [phone]); Manual therapy (97140); Self-care/home management [phone]); Therapeutic exercise (97110); Ultrasound (02964); Mechanical traction  (02987); Therapeutic activity (97530)   Recommended Consults:  None currently  Development of Plan of Care:  No change in POC.  Total Treatment Time (Time & Untimed): Total Treatment minutes: 45 Total Time in Timed Codes: Timed Code Minutes: 45     PT Treatment/Procedure Therapeutic Exercises minutes: 45             The patient has been instructed to contact our clinic if any questions or problems should arise.

## 2024-02-28 NOTE — Patient Instructions (Signed)
Medication Instructions:  Your physician recommends that you continue on your current medications as directed. Please refer to the Current Medication list given to you today.  Labwork: None ordered.  Testing/Procedures: None ordered.  Follow-Up:  Your physician wants you to follow-up as needed with Dr. Camnitz    Implantable Loop Recorder Placement, Care After This sheet gives you information about how to care for yourself after your procedure. Your health care provider may also give you more specific instructions. If you have problems or questions, contact your health care provider. What can I expect after the procedure? After the procedure, it is common to have: Soreness or discomfort near the incision. Some swelling or bruising near the incision.  Follow these instructions at home: Incision care  Monitor your cardiac device site for redness, swelling, and drainage. Call the device clinic at [phone] if you experience these symptoms or fever/chills.  Keep the large square bandage on your site for 24 hours and then you may remove it yourself. Keep the steri-strips underneath in place.   You may shower after 72 hours / 3 days from your procedure with the steri-strips in place. They will usually fall off on their own, or may be removed after 10 days. Pat dry.   Avoid lotions, ointments, or perfumes over your incision until it is well-healed.  Please do not submerge in water until your site is completely healed.   Your device is MRI compatible.   Remote monitoring is used to monitor your cardiac device from home. This monitoring is scheduled every month by our office. It allows us to keep an eye on the function of your device to ensure it is working properly.  If your wound site starts to bleed apply pressure.    For help with the monitor please call [employer] Monitor Support Specialist directly at [phone].    If you have any questions/concerns please call the device  clinic at [phone].  Activity  Return to your normal activities.  General instructions Follow instructions from your health care provider about how to manage your implantable loop recorder and transmit the information. Learn how to activate a recording if this is necessary for your type of device. You may go through a metal detection gate, and you may let someone hold a metal detector over your chest. Show your ID card if needed. Do not have an MRI unless you check with your health care provider first. Take over-the-counter and prescription medicines only as told by your health care provider. Keep all follow-up visits as told by your health care provider. This is important. Contact a health care provider if: You have redness, swelling, or pain around your incision. You have a fever. You have pain that is not relieved by your pain medicine. You have triggered your device because of fainting (syncope) or because of a heartbeat that feels like it is racing, slow, fluttering, or skipping (palpitations). Get help right away if you have: Chest pain. Difficulty breathing. Summary After the procedure, it is common to have soreness or discomfort near the incision. Change your dressing as told by your health care provider. Follow instructions from your health care provider about how to manage your implantable loop recorder and transmit the information. Keep all follow-up visits as told by your health care provider. This is important. This information is not intended to replace advice given to you by your health care provider. Make sure you discuss any questions you have with your health care provider. Document Released: 01/12/2015   Document Revised: 03/18/2017 Document Reviewed: 03/18/2017 [employer] Patient Education  2020 [employer]  

## 2024-03-30 ENCOUNTER — Ambulatory Visit

## 2024-04-30 ENCOUNTER — Ambulatory Visit

## 2024-05-31 ENCOUNTER — Ambulatory Visit

## 2024-07-01 ENCOUNTER — Ambulatory Visit

## 2024-08-01 ENCOUNTER — Ambulatory Visit

## 2024-09-01 ENCOUNTER — Ambulatory Visit

## 2024-10-02 ENCOUNTER — Ambulatory Visit

## 2024-11-02 ENCOUNTER — Ambulatory Visit

## 2024-12-03 ENCOUNTER — Ambulatory Visit

## 2025-01-03 ENCOUNTER — Ambulatory Visit

## 2025-02-03 ENCOUNTER — Ambulatory Visit

## 2025-03-06 ENCOUNTER — Ambulatory Visit
